# Patient Record
Sex: Female | Born: 1935 | Race: White | Hispanic: No | Smoking: Former smoker
Health system: Southern US, Community
[De-identification: ages and names within clinical notes are randomized; demographics above are authoritative.]

## PROBLEM LIST (undated history)

## (undated) ENCOUNTER — Emergency Department: Admission: EM | Payer: Medicare HMO | Source: Home / Self Care

## (undated) DIAGNOSIS — C50912 Malignant neoplasm of unspecified site of left female breast: Secondary | ICD-10-CM

## (undated) DIAGNOSIS — R319 Hematuria, unspecified: Secondary | ICD-10-CM

## (undated) DIAGNOSIS — I10 Essential (primary) hypertension: Secondary | ICD-10-CM

## (undated) HISTORY — DX: Hematuria, unspecified: R31.9

---

## 2004-10-24 DIAGNOSIS — C50912 Malignant neoplasm of unspecified site of left female breast: Secondary | ICD-10-CM

## 2004-10-24 HISTORY — DX: Malignant neoplasm of unspecified site of left female breast: C50.912

## 2004-10-24 HISTORY — PX: OTHER SURGICAL HISTORY: SHX169

## 2004-10-24 HISTORY — PX: BREAST LUMPECTOMY: SHX2

## 2004-12-10 ENCOUNTER — Ambulatory Visit: Payer: Self-pay | Admitting: Internal Medicine

## 2005-03-30 ENCOUNTER — Ambulatory Visit: Payer: Self-pay | Admitting: General Surgery

## 2005-04-11 ENCOUNTER — Ambulatory Visit: Payer: Self-pay | Admitting: Oncology

## 2005-04-21 ENCOUNTER — Ambulatory Visit: Payer: Self-pay | Admitting: General Surgery

## 2005-04-28 ENCOUNTER — Other Ambulatory Visit: Payer: Self-pay

## 2005-04-28 ENCOUNTER — Emergency Department: Payer: Self-pay | Admitting: General Practice

## 2005-05-09 ENCOUNTER — Encounter: Payer: Self-pay | Admitting: Internal Medicine

## 2005-05-15 ENCOUNTER — Emergency Department: Payer: Self-pay | Admitting: Emergency Medicine

## 2005-05-24 ENCOUNTER — Encounter: Payer: Self-pay | Admitting: Internal Medicine

## 2005-07-20 ENCOUNTER — Ambulatory Visit: Payer: Self-pay | Admitting: Oncology

## 2005-07-24 ENCOUNTER — Ambulatory Visit: Payer: Self-pay | Admitting: Oncology

## 2005-08-24 ENCOUNTER — Ambulatory Visit: Payer: Self-pay | Admitting: Oncology

## 2005-09-23 ENCOUNTER — Ambulatory Visit: Payer: Self-pay | Admitting: Oncology

## 2005-10-24 ENCOUNTER — Ambulatory Visit: Payer: Self-pay | Admitting: Oncology

## 2005-11-24 ENCOUNTER — Ambulatory Visit: Payer: Self-pay | Admitting: Oncology

## 2005-12-15 ENCOUNTER — Encounter: Payer: Self-pay | Admitting: Radiation Oncology

## 2005-12-22 ENCOUNTER — Ambulatory Visit: Payer: Self-pay | Admitting: Oncology

## 2005-12-22 ENCOUNTER — Encounter: Payer: Self-pay | Admitting: Radiation Oncology

## 2006-01-22 ENCOUNTER — Ambulatory Visit: Payer: Self-pay | Admitting: Oncology

## 2006-01-22 ENCOUNTER — Encounter: Payer: Self-pay | Admitting: Radiation Oncology

## 2006-02-21 ENCOUNTER — Ambulatory Visit: Payer: Self-pay | Admitting: Oncology

## 2006-04-07 ENCOUNTER — Ambulatory Visit: Payer: Self-pay | Admitting: Oncology

## 2006-04-23 ENCOUNTER — Ambulatory Visit: Payer: Self-pay | Admitting: Oncology

## 2006-05-24 ENCOUNTER — Ambulatory Visit: Payer: Self-pay | Admitting: Oncology

## 2006-06-24 ENCOUNTER — Ambulatory Visit: Payer: Self-pay | Admitting: Oncology

## 2006-07-24 ENCOUNTER — Ambulatory Visit: Payer: Self-pay | Admitting: Oncology

## 2006-08-16 ENCOUNTER — Ambulatory Visit: Payer: Self-pay | Admitting: Unknown Physician Specialty

## 2006-08-24 ENCOUNTER — Ambulatory Visit: Payer: Self-pay | Admitting: Oncology

## 2006-09-26 ENCOUNTER — Ambulatory Visit: Payer: Self-pay | Admitting: Oncology

## 2006-10-24 ENCOUNTER — Ambulatory Visit: Payer: Self-pay | Admitting: Oncology

## 2006-11-24 ENCOUNTER — Ambulatory Visit: Payer: Self-pay | Admitting: Oncology

## 2006-12-23 ENCOUNTER — Ambulatory Visit: Payer: Self-pay | Admitting: Oncology

## 2006-12-27 ENCOUNTER — Ambulatory Visit: Payer: Self-pay | Admitting: Surgery

## 2006-12-27 ENCOUNTER — Other Ambulatory Visit: Payer: Self-pay

## 2006-12-28 ENCOUNTER — Ambulatory Visit: Payer: Self-pay | Admitting: Surgery

## 2007-02-05 ENCOUNTER — Ambulatory Visit: Payer: Self-pay | Admitting: Oncology

## 2007-02-05 ENCOUNTER — Ambulatory Visit: Payer: Self-pay | Admitting: Radiation Oncology

## 2007-02-22 ENCOUNTER — Ambulatory Visit: Payer: Self-pay | Admitting: Radiation Oncology

## 2007-05-25 ENCOUNTER — Ambulatory Visit: Payer: Self-pay | Admitting: Oncology

## 2007-06-08 ENCOUNTER — Ambulatory Visit: Payer: Self-pay | Admitting: Oncology

## 2007-06-25 ENCOUNTER — Ambulatory Visit: Payer: Self-pay | Admitting: Oncology

## 2007-11-01 ENCOUNTER — Ambulatory Visit: Payer: Self-pay | Admitting: Family Medicine

## 2007-11-25 ENCOUNTER — Ambulatory Visit: Payer: Self-pay | Admitting: Oncology

## 2007-12-23 ENCOUNTER — Ambulatory Visit: Payer: Self-pay | Admitting: Oncology

## 2008-03-24 ENCOUNTER — Ambulatory Visit: Payer: Self-pay | Admitting: Oncology

## 2008-05-24 ENCOUNTER — Ambulatory Visit: Payer: Self-pay | Admitting: Oncology

## 2008-06-19 ENCOUNTER — Ambulatory Visit: Payer: Self-pay | Admitting: Oncology

## 2008-06-24 ENCOUNTER — Ambulatory Visit: Payer: Self-pay | Admitting: Oncology

## 2008-12-22 ENCOUNTER — Ambulatory Visit: Payer: Self-pay | Admitting: Oncology

## 2009-01-21 ENCOUNTER — Ambulatory Visit: Payer: Self-pay | Admitting: Oncology

## 2009-01-22 ENCOUNTER — Ambulatory Visit: Payer: Self-pay | Admitting: Oncology

## 2009-07-24 ENCOUNTER — Ambulatory Visit: Payer: Self-pay | Admitting: Oncology

## 2009-08-03 ENCOUNTER — Ambulatory Visit: Payer: Self-pay | Admitting: Oncology

## 2009-08-24 ENCOUNTER — Ambulatory Visit: Payer: Self-pay | Admitting: Oncology

## 2010-02-21 ENCOUNTER — Ambulatory Visit: Payer: Self-pay | Admitting: Oncology

## 2010-03-12 ENCOUNTER — Ambulatory Visit: Payer: Self-pay | Admitting: Oncology

## 2010-03-24 ENCOUNTER — Ambulatory Visit: Payer: Self-pay | Admitting: Oncology

## 2015-05-26 ENCOUNTER — Ambulatory Visit: Admission: RE | Admit: 2015-05-26 | Payer: Self-pay | Source: Ambulatory Visit | Admitting: Ophthalmology

## 2015-05-26 ENCOUNTER — Encounter: Admission: RE | Payer: Self-pay | Source: Ambulatory Visit

## 2015-05-26 SURGERY — PHACOEMULSIFICATION, CATARACT, WITH IOL INSERTION
Anesthesia: Choice | Laterality: Right

## 2015-08-03 ENCOUNTER — Encounter: Payer: Self-pay | Admitting: Oncology

## 2015-08-06 ENCOUNTER — Encounter: Payer: Self-pay | Admitting: Oncology

## 2016-02-02 DIAGNOSIS — N390 Urinary tract infection, site not specified: Secondary | ICD-10-CM | POA: Diagnosis not present

## 2016-02-02 DIAGNOSIS — M545 Low back pain: Secondary | ICD-10-CM | POA: Diagnosis not present

## 2016-02-16 DIAGNOSIS — L509 Urticaria, unspecified: Secondary | ICD-10-CM | POA: Diagnosis not present

## 2016-08-05 ENCOUNTER — Encounter: Payer: Self-pay | Admitting: Oncology

## 2016-08-05 DIAGNOSIS — Z853 Personal history of malignant neoplasm of breast: Secondary | ICD-10-CM | POA: Diagnosis not present

## 2016-08-05 DIAGNOSIS — Z1231 Encounter for screening mammogram for malignant neoplasm of breast: Secondary | ICD-10-CM | POA: Diagnosis not present

## 2016-08-24 DIAGNOSIS — I10 Essential (primary) hypertension: Secondary | ICD-10-CM | POA: Diagnosis not present

## 2016-10-05 DIAGNOSIS — E538 Deficiency of other specified B group vitamins: Secondary | ICD-10-CM | POA: Diagnosis not present

## 2016-10-05 DIAGNOSIS — F325 Major depressive disorder, single episode, in full remission: Secondary | ICD-10-CM | POA: Diagnosis not present

## 2016-10-05 DIAGNOSIS — Z0000 Encounter for general adult medical examination without abnormal findings: Secondary | ICD-10-CM | POA: Diagnosis not present

## 2016-10-05 DIAGNOSIS — I10 Essential (primary) hypertension: Secondary | ICD-10-CM | POA: Diagnosis not present

## 2016-10-05 DIAGNOSIS — Z23 Encounter for immunization: Secondary | ICD-10-CM | POA: Diagnosis not present

## 2016-11-22 DIAGNOSIS — H6123 Impacted cerumen, bilateral: Secondary | ICD-10-CM | POA: Diagnosis not present

## 2016-11-22 DIAGNOSIS — H903 Sensorineural hearing loss, bilateral: Secondary | ICD-10-CM | POA: Diagnosis not present

## 2017-01-09 DIAGNOSIS — E538 Deficiency of other specified B group vitamins: Secondary | ICD-10-CM | POA: Diagnosis not present

## 2017-01-09 DIAGNOSIS — R319 Hematuria, unspecified: Secondary | ICD-10-CM | POA: Diagnosis not present

## 2017-02-06 NOTE — Progress Notes (Signed)
02/07/2017 10:02 AM   Sophia Soto 02/19/1934 [id number]  Referring provider: Rusty Aus, MD [place] [facility] [place], [place] [address]  Chief Complaint  Patient presents with  . New Patient (Initial Visit)    hematuria referred by Dr. Emily Filbert     HPI: Patient is a 83 year old Caucasian female who presents today as a referral from their PCP, Dr. Emily Filbert, for microscopic hematuria.    Patient was found to have microscopic hematuria on 10/05/2016 with 4-10 RBC's/hpf and 01/09/2017 with 4-10 RBC's/hpf.  Patient does have a prior history of microscopic hematuria and had a cystoscopy several years ago.  No worrisome findings per patient.    She does not have a prior history of recurrent urinary tract infections, nephrolithiasis, trauma to the genitourinary tract or malignancies of the genitourinary tract.   She does not have a family medical history of nephrolithiasis, malignancies of the genitourinary tract or hematuria.   Today, she is having symptoms of nocturia and intermittency.  These are baselne.  Her UA today demonstrates 3-10 RBC's.  .  She is not experiencing any suprapubic pain, abdominal pain or flank pain.  She denies any recent fevers, chills, nausea or vomiting.   She has not had any recent imaging studies.   She is a former smoker, with a 1/2 ppd history.  Quit 30 years ago.  She was not exposed to secondhand smoke.  She has not worked with chemicals.        PMH: Past Medical History:  Diagnosis Date  . Breast cancer ([place])   . Hematuria     Surgical History: Past Surgical History:  Procedure Laterality Date  . BREAST LUMPECTOMY  2006  . broken neck  2006    Home Medications:  Allergies as of 02/07/2017      Reactions   Penicillin G Rash      Medication List       Accurate as of 02/07/17 10:02 AM. Always use your most recent med list.          aspirin EC 81 MG tablet Take by mouth.     Biotin 1 MG Caps Take by mouth.   FISH OIL PO Take by mouth.   ibuprofen 200 MG tablet Commonly known as:  ADVIL,MOTRIN Take by mouth.   lisinopril 10 MG tablet Commonly known as:  PRINIVIL,ZESTRIL Take by mouth.   sertraline 100 MG tablet Commonly known as:  ZOLOFT Take by mouth.   VITAMIN D-1000 MAX ST 1000 units tablet Generic drug:  Cholecalciferol Take by mouth.       Allergies:  Allergies  Allergen Reactions  . Penicillin G Rash    Family History: Family History  Problem Relation Age of Onset  . Prostate cancer Neg Hx   . Kidney cancer Neg Hx   . Bladder Cancer Neg Hx     Social History:  reports that she has quit smoking. She has never used smokeless tobacco. She reports that she drinks alcohol. She reports that she does not use drugs.  ROS: UROLOGY Frequent Urination?: No Hard to postpone urination?: No Burning/pain with urination?: No Get up at night to urinate?: Yes Leakage of urine?: No Urine stream starts and stops?: Yes Trouble starting stream?: No Do you have to strain to urinate?: No Blood in urine?: No Urinary tract infection?: No Sexually transmitted disease?: No Injury to kidneys or bladder?: No Painful intercourse?: No Weak stream?: No Currently pregnant?: No Vaginal  bleeding?: No Last menstrual period?: n  Gastrointestinal Nausea?: No Vomiting?: No Indigestion/heartburn?: No Diarrhea?: No Constipation?: No  Constitutional Fever: No Night sweats?: No Weight loss?: No Fatigue?: No  Skin Skin rash/lesions?: No Itching?: No  Eyes Blurred vision?: No Double vision?: No  Ears/Nose/Throat Sore throat?: No Sinus problems?: No  Hematologic/Lymphatic Swollen glands?: No Easy bruising?: Yes  Cardiovascular Leg swelling?: No Chest pain?: No  Respiratory Cough?: No Shortness of breath?: No  Endocrine Excessive thirst?: No  Musculoskeletal Back pain?: No Joint pain?: No  Neurological Headaches?:  No Dizziness?: Yes  Psychologic Depression?: No Anxiety?: Yes  Physical Exam: BP 135/82   Pulse 73   Ht 5\' 4"  (1.626 m)   Wt 125 lb 4.8 oz (56.8 kg)   BMI 21.51 kg/m   Constitutional: Well nourished. Alert and oriented, No acute distress. HEENT: [place] AT, moist mucus membranes. Trachea midline, no masses. Cardiovascular: No clubbing, cyanosis, or edema. Respiratory: Normal respiratory effort, no increased work of breathing. GI: Abdomen is soft, non tender, non distended, no abdominal masses. Liver and spleen not palpable.  No hernias appreciated.  Stool sample for occult testing is not indicated.   GU: No CVA tenderness.  No bladder fullness or masses.   Skin: No rashes, bruises or suspicious lesions. Lymph: No cervical or inguinal adenopathy. Neurologic: Grossly intact, no focal deficits, moving all 4 extremities. Psychiatric: Normal mood and affect.  Laboratory Data: Urinalysis 3-10 RBC's.  See EPIC.     Assessment & Plan:    1. Microscopic hematuria  - I explained to the patient that there are a number of causes that can be associated with blood in the urine, such as stones,  UTI's, damage to the urinary tract and/or cancer.  - At this time, I felt that the patient warranted further urologic evaluation.   The AUA guidelines state that a CT urogram is the preferred imaging study to evaluate hematuria.  - I explained to the patient that a contrast material will be injected into a vein and that in rare instances, an allergic reaction can result and may even life threatening   The patient denies any allergies to contrast, iodine and/or seafood and is not taking metformin.  - Her reproductive status is postmenopausal  - Following the imaging study,  I've recommended a cystoscopy. I described how this is performed, typically in an office setting with a flexible cystoscope. We described the risks, benefits, and possible side effects, the most common of which is a minor amount of blood  in the urine and/or burning which usually resolves in 24 to 48 hours.    - The patient had the opportunity to ask questions which were answered. Based upon this discussion, the patient is willing to proceed. Therefore, I've ordered: a CT Urogram and cystoscopy.  - The patient will return following all of the above for discussion of the results.   - UA  - Urine culture  - BUN + creatinine     Return for CT Urogram report and cystoscopy.  These notes generated with voice recognition software. I apologize for typographical errors.  Zara Council, [facility] [address], [place], [place] [address] [phone]

## 2017-02-07 ENCOUNTER — Ambulatory Visit: Payer: PPO | Admitting: Urology

## 2017-02-07 ENCOUNTER — Encounter: Payer: Self-pay | Admitting: Urology

## 2017-02-07 VITALS — BP 135/82 | HR 73 | Ht 64.0 in | Wt 125.3 lb

## 2017-02-07 DIAGNOSIS — R3129 Other microscopic hematuria: Secondary | ICD-10-CM | POA: Diagnosis not present

## 2017-02-07 LAB — URINALYSIS, COMPLETE
Bilirubin, UA: NEGATIVE
GLUCOSE, UA: NEGATIVE
Ketones, UA: NEGATIVE
LEUKOCYTES UA: NEGATIVE
NITRITE UA: NEGATIVE
PROTEIN UA: NEGATIVE
SPEC GRAV UA: 1.01 (ref 1.005–1.030)
Urobilinogen, Ur: 0.2 mg/dL (ref 0.2–1.0)
pH, UA: 6 (ref 5.0–7.5)

## 2017-02-07 LAB — MICROSCOPIC EXAMINATION: Bacteria, UA: NONE SEEN

## 2017-02-07 NOTE — Patient Instructions (Addendum)

## 2017-02-08 LAB — BUN+CREAT
BUN/Creatinine Ratio: 15 (ref 12–28)
BUN: 15 mg/dL (ref 8–27)
Creatinine, Ser: 0.97 mg/dL (ref 0.57–1.00)
GFR calc Af Amer: 64 mL/min/{1.73_m2} (ref >59)
GFR calc non Af Amer: 55 mL/min/{1.73_m2} — ABNORMAL LOW (ref >59)

## 2017-02-10 LAB — CULTURE, URINE COMPREHENSIVE

## 2017-02-28 ENCOUNTER — Encounter: Payer: Self-pay | Admitting: Urology

## 2017-02-28 ENCOUNTER — Ambulatory Visit: Payer: PPO | Admitting: Urology

## 2017-02-28 VITALS — BP 139/77 | HR 67 | Ht 64.0 in | Wt 125.8 lb

## 2017-02-28 DIAGNOSIS — R3129 Other microscopic hematuria: Secondary | ICD-10-CM

## 2017-02-28 MED ORDER — LIDOCAINE HCL 2 % EX GEL: 1.0000 "application " | Freq: Once | CUTANEOUS | Status: AC

## 2017-02-28 MED ORDER — CIPROFLOXACIN HCL 500 MG PO TABS: 500.0000 mg | ORAL_TABLET | Freq: Once | ORAL | Status: DC

## 2017-02-28 NOTE — Progress Notes (Signed)
   02/28/17  CC:  Chief Complaint  Patient presents with  . Cysto    HPI:  Blood pressure 139/77, pulse 67, height 5\' 4"  (1.626 m), weight 57.1 kg (125 lb 12.8 oz). NED. A&Ox3.   No respiratory distress   Abd soft, NT, ND Normal external genitalia with patent urethral meatus  Cystoscopy Procedure Note  Patient identification was confirmed, informed consent was obtained, and patient was prepped using Betadine solution.  Lidocaine jelly was administered per urethral meatus.    Preoperative abx where received prior to procedure.    Procedure: - Flexible cystoscope introduced, without any difficulty.   - Thorough search of the bladder revealed:    normal urethral meatus    normal urothelium    no stones    no ulcers     no tumors    no urethral polyps    no trabeculation  - Ureteral orifices were normal in position and appearance.  Post-Procedure: - Patient tolerated the procedure well  Assessment/ Plan:  The patient cystoscopic evaluation was normal. The patient has a CT scan pending. We'll contact her with the results as long as they're normal, she can follow with [place] as needed.    Ardis Hughs, MD

## 2017-03-01 LAB — URINALYSIS, COMPLETE
Bilirubin, UA: NEGATIVE
GLUCOSE, UA: NEGATIVE
KETONES UA: NEGATIVE
LEUKOCYTES UA: NEGATIVE
Nitrite, UA: NEGATIVE
Protein, UA: NEGATIVE
SPEC GRAV UA: 1.01 (ref 1.005–1.030)
Urobilinogen, Ur: 0.2 mg/dL (ref 0.2–1.0)
pH, UA: 6 (ref 5.0–7.5)

## 2017-03-01 LAB — MICROSCOPIC EXAMINATION
Bacteria, UA: NONE SEEN
Epithelial Cells (non renal): NONE SEEN /hpf (ref 0–10)

## 2017-03-08 ENCOUNTER — Ambulatory Visit
Admission: RE | Admit: 2017-03-08 | Discharge: 2017-03-08 | Disposition: A | Discharge status: Home / Self Care | Payer: PPO | Source: Ambulatory Visit | Attending: Urology | Admitting: Urology

## 2017-03-08 DIAGNOSIS — R3129 Other microscopic hematuria: Secondary | ICD-10-CM

## 2017-03-08 DIAGNOSIS — R918 Other nonspecific abnormal finding of lung field: Secondary | ICD-10-CM | POA: Insufficient documentation

## 2017-03-08 DIAGNOSIS — I700 Atherosclerosis of aorta: Secondary | ICD-10-CM | POA: Diagnosis not present

## 2017-03-08 HISTORY — DX: Essential (primary) hypertension: I10

## 2017-03-08 HISTORY — DX: Malignant neoplasm of unspecified site of left female breast: C50.912

## 2017-03-08 MED ORDER — IOPAMIDOL (ISOVUE-300) INJECTION 61%
125.0000 mL | Freq: Once | INTRAVENOUS | Status: AC | PRN
  Administered 2017-03-08: 125 mL via INTRAVENOUS

## 2017-03-14 ENCOUNTER — Telehealth: Payer: Self-pay

## 2017-03-14 NOTE — Telephone Encounter (Signed)
Sophia Hughs, MD  Toniann Fail C, LPN        Please contact patient and let her know that her CT scan was normal. No follow-up needed.  Thank you,  bh    [facility]

## 2017-03-14 NOTE — Telephone Encounter (Signed)
Spoke with pt in reference to CT results. Pt voiced understanding.  

## 2017-03-20 ENCOUNTER — Telehealth: Payer: Self-pay | Admitting: Urology

## 2017-03-28 NOTE — Telephone Encounter (Signed)
Error

## 2017-03-28 NOTE — Telephone Encounter (Signed)
error 

## 2017-04-29 ENCOUNTER — Encounter: Payer: Self-pay | Admitting: Emergency Medicine

## 2017-04-29 ENCOUNTER — Emergency Department: Payer: PPO

## 2017-04-29 ENCOUNTER — Emergency Department
Admission: EM | Admit: 2017-04-29 | Discharge: 2017-04-29 | Disposition: A | Discharge status: Home / Self Care | Payer: PPO | Attending: Emergency Medicine | Admitting: Emergency Medicine

## 2017-04-29 DIAGNOSIS — Y92512 Supermarket, store or market as the place of occurrence of the external cause: Secondary | ICD-10-CM | POA: Diagnosis not present

## 2017-04-29 DIAGNOSIS — Y998 Other external cause status: Secondary | ICD-10-CM | POA: Insufficient documentation

## 2017-04-29 DIAGNOSIS — Z87891 Personal history of nicotine dependence: Secondary | ICD-10-CM | POA: Insufficient documentation

## 2017-04-29 DIAGNOSIS — Z853 Personal history of malignant neoplasm of breast: Secondary | ICD-10-CM | POA: Insufficient documentation

## 2017-04-29 DIAGNOSIS — W01198A Fall on same level from slipping, tripping and stumbling with subsequent striking against other object, initial encounter: Secondary | ICD-10-CM | POA: Diagnosis not present

## 2017-04-29 DIAGNOSIS — S6991XA Unspecified injury of right wrist, hand and finger(s), initial encounter: Secondary | ICD-10-CM | POA: Diagnosis not present

## 2017-04-29 DIAGNOSIS — W19XXXA Unspecified fall, initial encounter: Secondary | ICD-10-CM

## 2017-04-29 DIAGNOSIS — Z7982 Long term (current) use of aspirin: Secondary | ICD-10-CM | POA: Insufficient documentation

## 2017-04-29 DIAGNOSIS — Y9301 Activity, walking, marching and hiking: Secondary | ICD-10-CM | POA: Diagnosis not present

## 2017-04-29 DIAGNOSIS — S0181XA Laceration without foreign body of other part of head, initial encounter: Secondary | ICD-10-CM | POA: Diagnosis not present

## 2017-04-29 DIAGNOSIS — I10 Essential (primary) hypertension: Secondary | ICD-10-CM | POA: Insufficient documentation

## 2017-04-29 DIAGNOSIS — S0121XA Laceration without foreign body of nose, initial encounter: Secondary | ICD-10-CM | POA: Diagnosis not present

## 2017-04-29 DIAGNOSIS — Z23 Encounter for immunization: Secondary | ICD-10-CM | POA: Insufficient documentation

## 2017-04-29 DIAGNOSIS — Z79899 Other long term (current) drug therapy: Secondary | ICD-10-CM | POA: Diagnosis not present

## 2017-04-29 MED ORDER — TETANUS-DIPHTH-ACELL PERTUSSIS 5-2.5-18.5 LF-MCG/0.5 IM SUSP
0.5000 mL | Freq: Once | INTRAMUSCULAR | Status: AC
  Administered 2017-04-29: 0.5 mL via INTRAMUSCULAR
  Filled 2017-04-29: qty 0.5

## 2017-04-29 MED ORDER — LIDOCAINE HCL (PF) 1 % IJ SOLN
INTRAMUSCULAR | Status: AC
  Filled 2017-04-29: qty 5

## 2017-04-29 NOTE — ED Notes (Signed)
Pt sitting in bed. Wounds dressed in triage. Pt tripped and fell onto parking lot. Able to ambulate without assistance. Injury to hand and forehead and nose (abrasions and skin tears)

## 2017-04-29 NOTE — ED Provider Notes (Signed)
[facility] Emergency Department Provider Note  Time seen: 4:44 PM  I have reviewed the triage vital signs and the nursing notes.   HISTORY  Chief Complaint Fall    HPI Sophia Soto is a 83 y.o. female with a past medical history of hypertension who presents the emergency department after a fall. According to the patient she was walking into the store when she tripped falling forward landing on cement. Denies loss of consciousness nausea or vomiting. Patient has been ambulatory since the fall. She did suffer a cut to her forehead, the bridge of her nose, and skin tears to her right hand. Patient is awake alert oriented, well appearing, laughing and joking.  Past Medical History:  Diagnosis Date  . Breast cancer, left ([place]) 2006   Lumpectomy, chemo + rad tx's  . Hematuria   . Hypertension     There are no active problems to display for this patient.   Past Surgical History:  Procedure Laterality Date  . BREAST LUMPECTOMY  2006  . broken neck  2006    Prior to Admission medications   Medication Sig Start Date End Date Taking? Authorizing Provider  aspirin EC 81 MG tablet Take by mouth.    [provider]  Biotin 1 MG CAPS Take by mouth.    [provider]  Cholecalciferol (VITAMIN D-1000 MAX ST) 1000 units tablet Take by mouth.    [provider]  ibuprofen (ADVIL,MOTRIN) 200 MG tablet Take by mouth.    [provider]  lisinopril (PRINIVIL,ZESTRIL) 10 MG tablet Take by mouth. 10/26/16 10/26/17  [provider]  Omega-3 Fatty Acids (FISH OIL PO) Take by mouth.    [provider]  sertraline (ZOLOFT) 100 MG tablet Take by mouth.    [provider]    Allergies  Allergen Reactions  . Penicillin G Rash    Family History  Problem Relation Age of Onset  . Prostate cancer Neg Hx   . Kidney cancer Neg Hx   . Bladder Cancer Neg Hx     Social History Social History  Substance Use  Topics  . Smoking status: Former Research scientist (life sciences)  . Smokeless tobacco: Never Used     Comment: quit 1990  . Alcohol use Yes     Comment: wine at night    Review of Systems Constitutional: Negative for fever. Eyes: Negative for visual changes. ENT: Laceration to bridge of nose Cardiovascular: Negative for chest pain. Respiratory: Negative for shortness of breath. Gastrointestinal: Negative for abdominal pain or negative for nausea or vomiting. Musculoskeletal: Negative for back pain. Negative for neck pain. Skin: Laceration to forehead, bridge of nose and skin tears to right hand Neurological: Negative for headaches, focal weakness or numbness. All other ROS negative  ____________________________________________   PHYSICAL EXAM:  VITAL SIGNS: ED Triage Vitals  Enc Vitals Group     BP 04/29/17 1418 (!) 173/85     Pulse Rate 04/29/17 1418 89     Resp 04/29/17 1418 18     Temp 04/29/17 1418 97.7 F (36.5 C)     Temp Source 04/29/17 1418 Oral     SpO2 04/29/17 1418 99 %     Weight 04/29/17 1418 125 lb (56.7 kg)     Height 04/29/17 1418 5\' 4"  (1.626 m)     Head Circumference --      Peak Flow --      Pain Score 04/29/17 1417 9     Pain Loc --  Pain Edu? --      Excl. in [place]? --     Constitutional: Alert and oriented. Well appearing and in no distress. Eyes: Normal exam, EOMI, PERRL ENT   Head: 1 cm laceration to the bridge of her nose. 1.5 cm vertical laceration to the forehead   Nose: No congestion/rhinnorhea.   Mouth/Throat: Mucous membranes are moist. No oral trauma Cardiovascular: Normal rate, regular rhythm. No murmur Respiratory: Normal respiratory effort without tachypnea nor retractions. Breath sounds are clear  Gastrointestinal: Soft and nontender. No distention.   Musculoskeletal: Patient does have 3 skin tears to the dorsal aspect of the right hand, all with good skin coverage. No C-spine T-spine or L-spine tenderness. Good range of motion in all  extremities and in all joints. Neurologic:  Normal speech and language. No gross focal neurologic deficits. Skin:  Skin tears and lacerations as noted above Psychiatric: Mood and affect are normal  ____________________________________________   RADIOLOGY  Right hand x-ray negative  ____________________________________________   INITIAL IMPRESSION / ASSESSMENT AND PLAN / ED COURSE  Pertinent labs & imaging results that were available during my care of the patient were reviewed by me and considered in my medical decision making (see chart for details).  Patient presents to the emergency department for a fall. Patient does have a non-gaping 1.5 cm laceration to the center of her forehead which was repaired with Dermabond. Patient does have a 1 cm laceration to the bridge of the nose with mild gaping, this was repaired with 3 5-0 rapid Vicryl sutures. Skin tears were washed, had good skin coverage, Steri-Strips applied. Discussed PCP follow-up for recheck.  LACERATION REPAIR Performed by: Harvest Dark Authorized by: Harvest Dark Consent: Verbal consent obtained. Risks and benefits: risks, benefits and alternatives were discussed Consent given by: patient Patient identity confirmed: provided demographic data Prepped and Draped in normal sterile fashion Wound explored  Laceration Location: nose   Laceration Length: 1  cm  No Foreign Bodies seen or palpated  Anesthesia: local infiltration  Local anesthetic: lidocaine 1% wo epinephrine  Anesthetic total: 2 ml  Irrigation method: syringe Amount of cleaning: standard  Skin closure: 5-0 rapid vicryl   Number of sutures: 3   Technique: simple interrupted  Patient tolerance: Patient tolerated the procedure well with no immediate complications.   ____________________________________________   FINAL CLINICAL IMPRESSION(S) / ED DIAGNOSES  fall  laceration    Harvest Dark, MD 04/29/17 [phone]

## 2017-04-29 NOTE — ED Triage Notes (Addendum)
Patient presents to the ED with abrasion to forehead and bridge of nose and multiple skin tears to her right hand.  Patient states she tripped over a concrete median as she was in the parking lot of the [facility] store to get rum for pina coladas.  Patient denies losing consciousness or dizziness.  Patient does not take blood thinners.

## 2017-05-19 DIAGNOSIS — M25511 Pain in right shoulder: Secondary | ICD-10-CM | POA: Diagnosis not present

## 2017-05-25 DIAGNOSIS — M25511 Pain in right shoulder: Secondary | ICD-10-CM | POA: Diagnosis not present

## 2017-08-07 DIAGNOSIS — Z1231 Encounter for screening mammogram for malignant neoplasm of breast: Secondary | ICD-10-CM | POA: Diagnosis not present

## 2017-08-07 DIAGNOSIS — Z853 Personal history of malignant neoplasm of breast: Secondary | ICD-10-CM | POA: Diagnosis not present

## 2017-10-25 DIAGNOSIS — E538 Deficiency of other specified B group vitamins: Secondary | ICD-10-CM | POA: Diagnosis not present

## 2017-10-25 DIAGNOSIS — R311 Benign essential microscopic hematuria: Secondary | ICD-10-CM | POA: Diagnosis not present

## 2017-10-25 DIAGNOSIS — Z0000 Encounter for general adult medical examination without abnormal findings: Secondary | ICD-10-CM | POA: Diagnosis not present

## 2017-10-25 DIAGNOSIS — E782 Mixed hyperlipidemia: Secondary | ICD-10-CM | POA: Diagnosis not present

## 2018-02-09 IMAGING — CT CT ABD-PEL WO/W CM
2 of 6 series · 13 of 32 positions shown, 18 images · IV contrast (iopamidol)
Comparison: None.

CLINICAL DATA: Microscopic hematuria.

EXAM:
CT ABDOMEN AND PELVIS WITHOUT AND WITH CONTRAST
TECHNIQUE: Multidetector CT imaging of the abdomen and pelvis was performed
following the standard protocol before and following the bolus
administration of intravenous contrast.
CONTRAST:  125mL 13WQ2Y-QUU IOPAMIDOL (13WQ2Y-QUU) INJECTION 61%

[Series 2: axial pre · axial · non-contrast · 0.64mm/px · z∈[-898,-588]mm · 6 of 88 slices shown]
[im 13/88  soft-tissue]
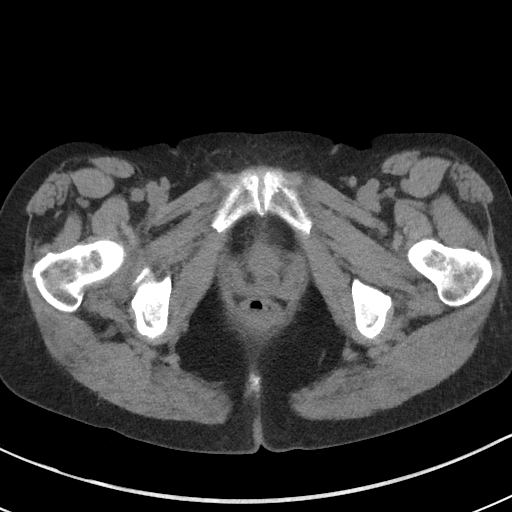
[im 25/88  soft-tissue]
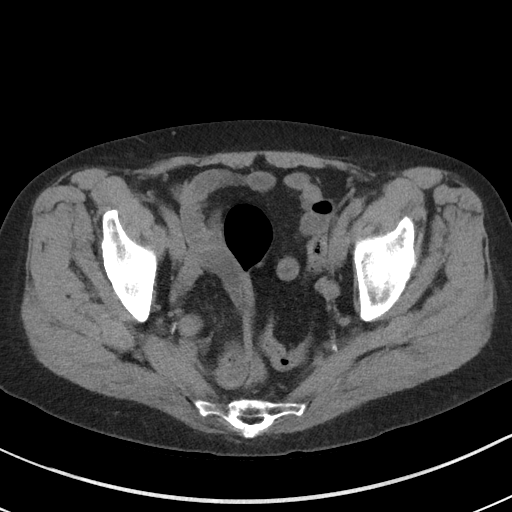
[im 38/88  soft-tissue]
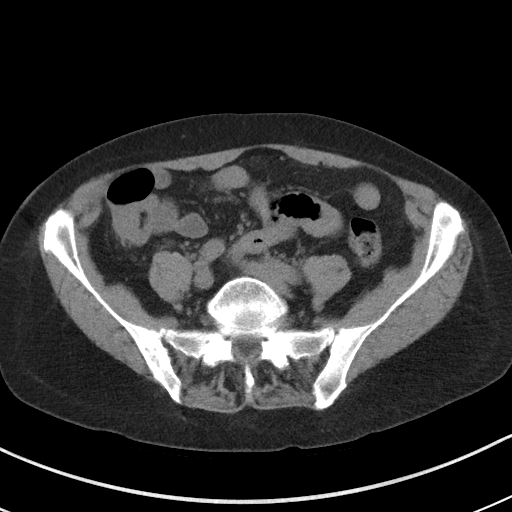
[im 50/88  soft-tissue]
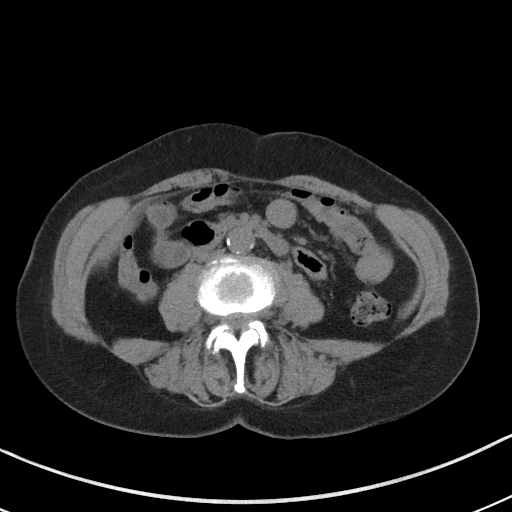
[im 63/88  soft-tissue]
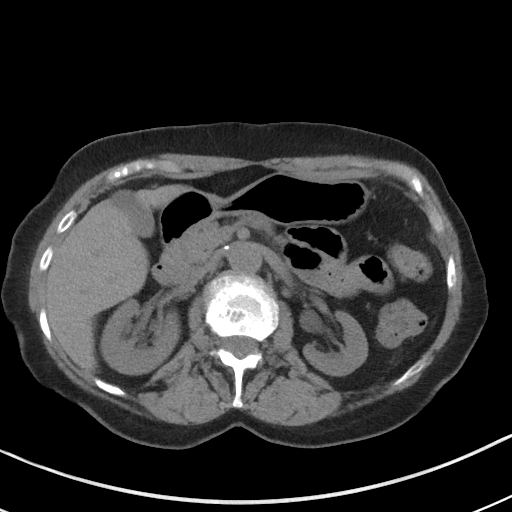
[im 75/88  soft-tissue]
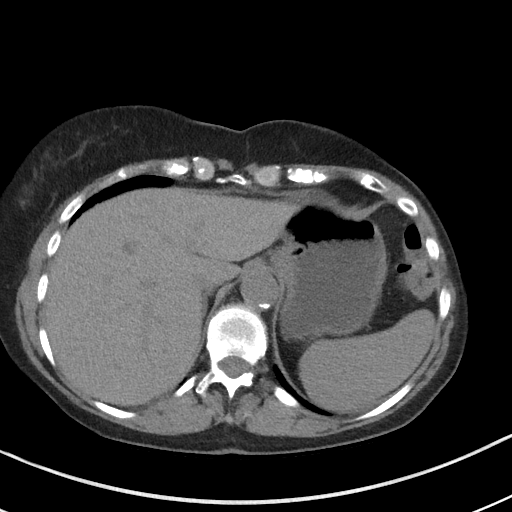

[Series 13: axial delay · axial · delayed · 0.60mm/px · z∈[-1010,-656]mm · 7 of 95 slices shown, 12 images]
[im 12/95  soft-tissue]
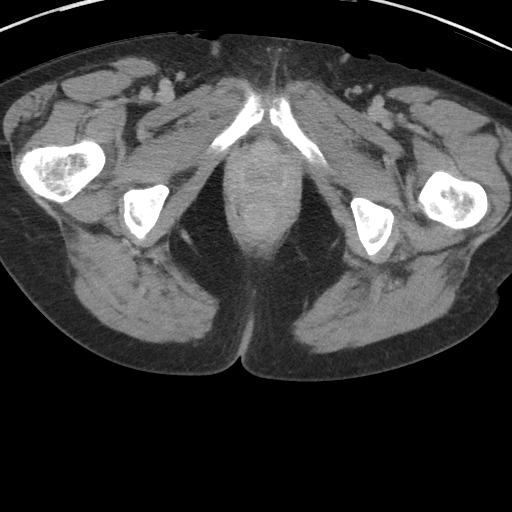
[im 12/95  bone]
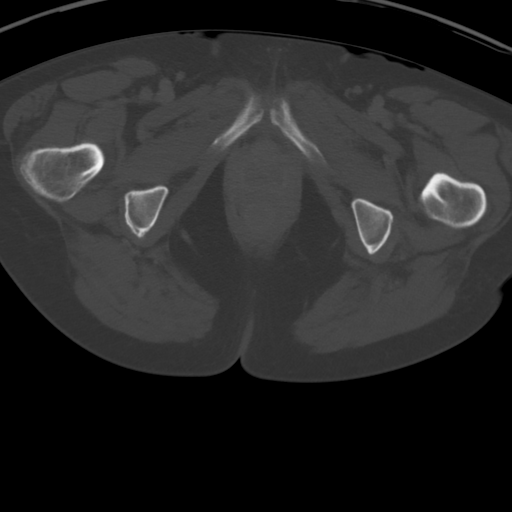
[im 24/95  soft-tissue]
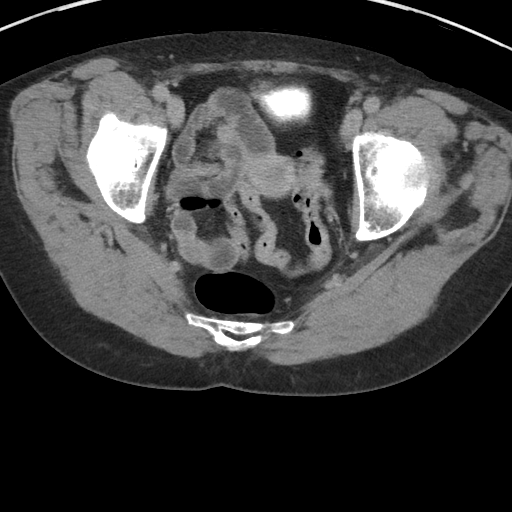
[im 36/95  soft-tissue]
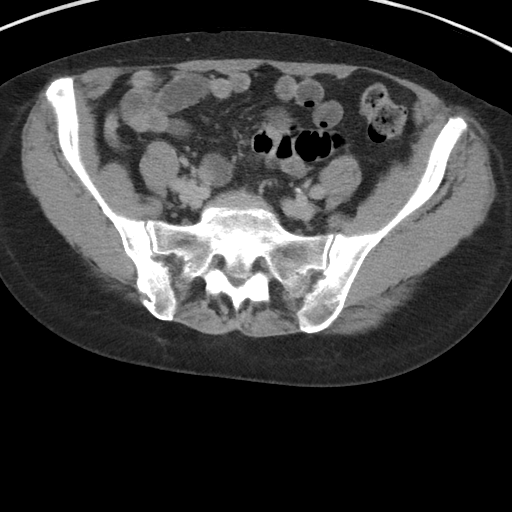
[im 48/95  soft-tissue]
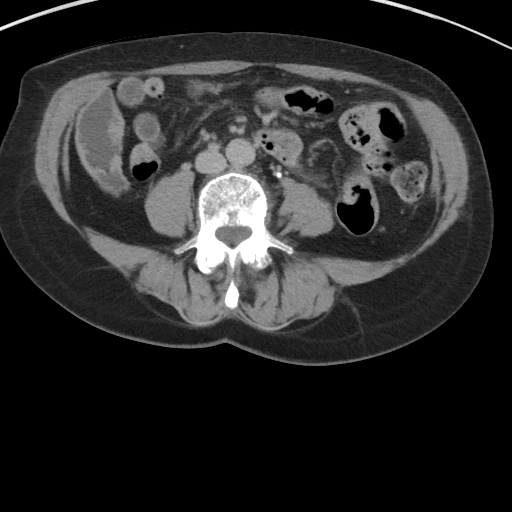
[im 48/95  lung]
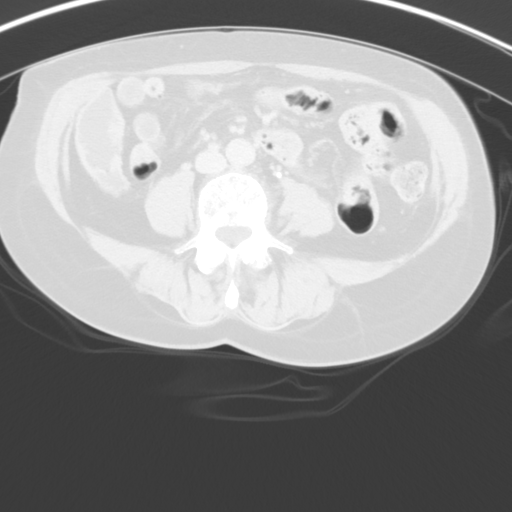
[im 59/95  soft-tissue]
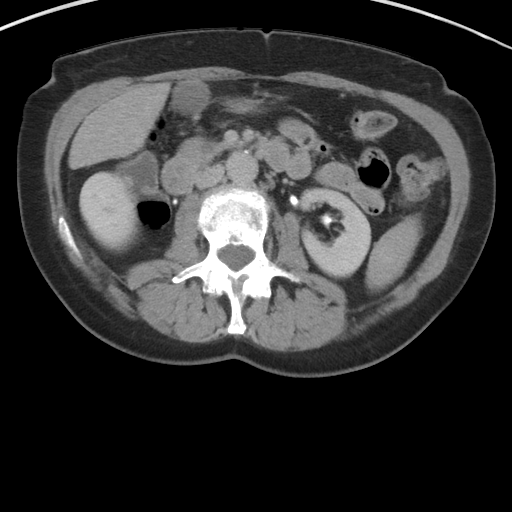
[im 59/95  lung]
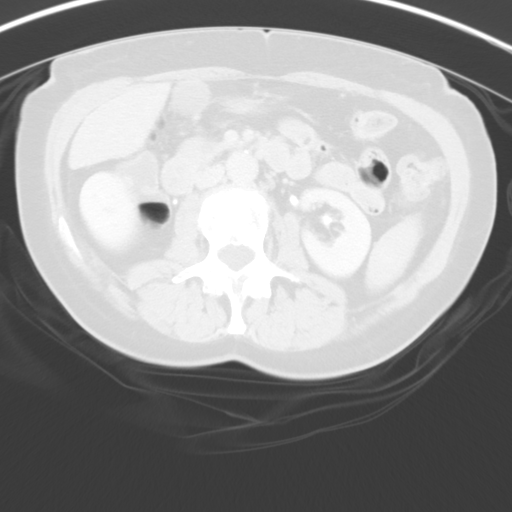
[im 71/95  soft-tissue]
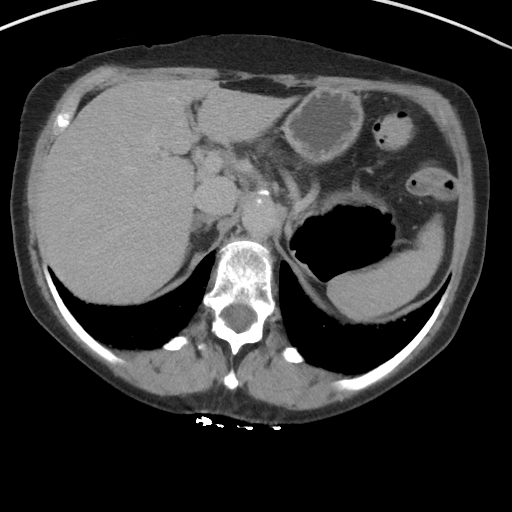
[im 71/95  lung]
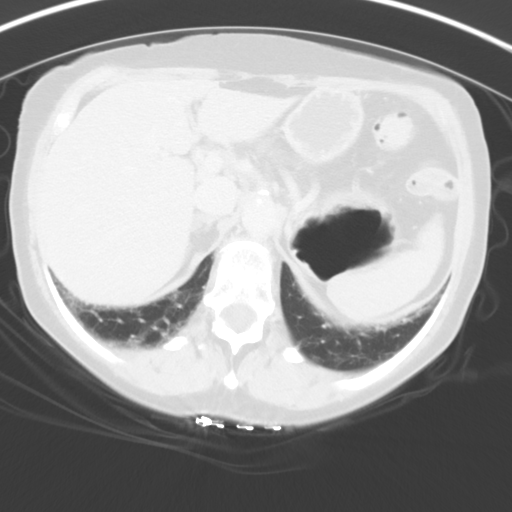
[im 83/95  soft-tissue]
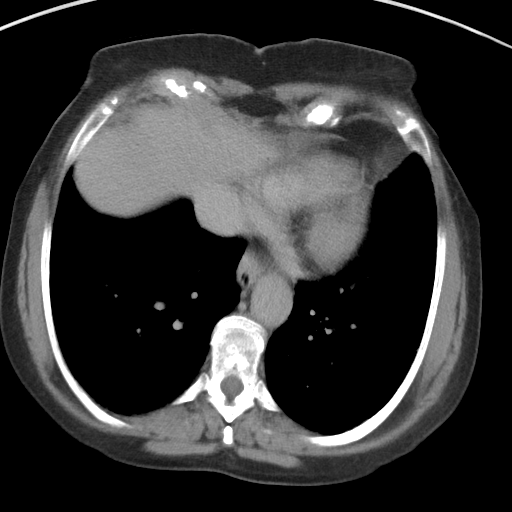
[im 83/95  lung]
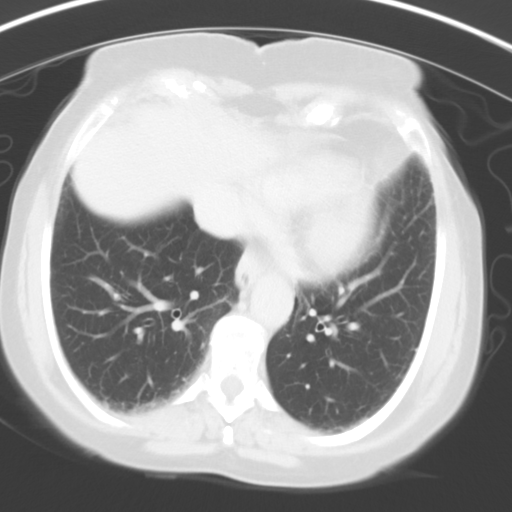

[13 of 32 positions shown; findings below may reference images not displayed]

FINDINGS: Lower chest: Small nodules in the left lower lobe measure up to 4
mm. Heart size normal. No pericardial or pleural effusion. Calcified
subcarinal lymph nodes.

Hepatobiliary: Low-attenuation lesions in the liver measure up to
2.1 cm and are likely cysts. Liver and gallbladder are otherwise
unremarkable. No biliary ductal dilatation.

Pancreas: Negative.

Spleen: Negative.

Adrenals/Urinary Tract: Adrenal glands and kidneys are unremarkable.
No urinary stones. Mid and lower portions of both ureters are poorly
opacified, limiting evaluation. Otherwise, no filling defects in the
intrarenal collecting systems, visualized portions of the ureters or
bladder.

Stomach/Bowel: Stomach, small bowel, appendix and colon are
unremarkable.

Vascular/Lymphatic: Atherosclerotic calcification of the arterial
vasculature without abdominal aortic aneurysm. No pathologically
enlarged lymph nodes.

Reproductive: Uterus is visualized.  No adnexal mass.

Other: No free fluid.  Mesenteries and peritoneum are unremarkable.

Musculoskeletal: Postoperative changes in the medial right iliac
wing. Degenerative changes in the spine. No worrisome lytic or
sclerotic lesions. Minimal grade 1 anterolisthesis of L4 on L5.
IMPRESSION: 1. Mid and lower portions of both ureters are poorly opacified,
limiting evaluation. Otherwise, no findings to explain the patient's
hematuria.
2. Left lower lobe nodules measure 4 mm, nonspecific. No follow-up
needed if patient is low-risk (and has no known or suspected primary
neoplasm). Non-contrast chest CT can be considered in 12 months if
patient is high-risk. This recommendation follows the consensus
statement: Guidelines for Management of Incidental Pulmonary Nodules
Detected on CT Images: From the [HOSPITAL] 7581; Radiology
3.  Aortic atherosclerosis (6673Q-170.0).

## 2018-05-21 DIAGNOSIS — H25813 Combined forms of age-related cataract, bilateral: Secondary | ICD-10-CM | POA: Diagnosis not present

## 2018-06-15 DIAGNOSIS — H02839 Dermatochalasis of unspecified eye, unspecified eyelid: Secondary | ICD-10-CM | POA: Diagnosis not present

## 2018-06-15 DIAGNOSIS — H2513 Age-related nuclear cataract, bilateral: Secondary | ICD-10-CM | POA: Diagnosis not present

## 2018-06-15 DIAGNOSIS — H18413 Arcus senilis, bilateral: Secondary | ICD-10-CM | POA: Diagnosis not present

## 2018-08-01 DIAGNOSIS — H6123 Impacted cerumen, bilateral: Secondary | ICD-10-CM | POA: Diagnosis not present

## 2018-08-01 DIAGNOSIS — H900 Conductive hearing loss, bilateral: Secondary | ICD-10-CM | POA: Diagnosis not present

## 2018-08-08 DIAGNOSIS — Z1231 Encounter for screening mammogram for malignant neoplasm of breast: Secondary | ICD-10-CM | POA: Diagnosis not present

## 2018-08-08 DIAGNOSIS — Z853 Personal history of malignant neoplasm of breast: Secondary | ICD-10-CM | POA: Diagnosis not present

## 2018-10-31 DIAGNOSIS — F325 Major depressive disorder, single episode, in full remission: Secondary | ICD-10-CM | POA: Diagnosis not present

## 2018-10-31 DIAGNOSIS — Z0000 Encounter for general adult medical examination without abnormal findings: Secondary | ICD-10-CM | POA: Diagnosis not present

## 2018-10-31 DIAGNOSIS — E538 Deficiency of other specified B group vitamins: Secondary | ICD-10-CM | POA: Diagnosis not present

## 2018-10-31 DIAGNOSIS — E782 Mixed hyperlipidemia: Secondary | ICD-10-CM | POA: Diagnosis not present

## 2018-11-01 DIAGNOSIS — E538 Deficiency of other specified B group vitamins: Secondary | ICD-10-CM | POA: Diagnosis not present

## 2018-11-01 DIAGNOSIS — E782 Mixed hyperlipidemia: Secondary | ICD-10-CM | POA: Diagnosis not present

## 2020-01-02 ENCOUNTER — Other Ambulatory Visit: Payer: Self-pay | Admitting: Internal Medicine

## 2020-01-02 ENCOUNTER — Ambulatory Visit
Admission: RE | Admit: 2020-01-02 | Discharge: 2020-01-02 | Disposition: A | Discharge status: Home / Self Care | Payer: Medicare HMO | Source: Ambulatory Visit | Attending: Internal Medicine | Admitting: Internal Medicine

## 2020-01-02 ENCOUNTER — Other Ambulatory Visit: Payer: Self-pay

## 2020-01-02 DIAGNOSIS — R1901 Right upper quadrant abdominal swelling, mass and lump: Secondary | ICD-10-CM

## 2020-01-02 DIAGNOSIS — Z853 Personal history of malignant neoplasm of breast: Secondary | ICD-10-CM

## 2020-02-28 ENCOUNTER — Encounter: Payer: Self-pay | Admitting: Emergency Medicine

## 2020-02-28 ENCOUNTER — Emergency Department: Payer: Medicare HMO

## 2020-02-28 ENCOUNTER — Other Ambulatory Visit: Payer: Self-pay

## 2020-02-28 ENCOUNTER — Inpatient Hospital Stay
Admission: EM | Admit: 2020-02-28 | Discharge: 2020-03-02 | DRG: 871 | Disposition: A | Discharge status: Home / Self Care | Payer: Medicare HMO | Attending: Internal Medicine | Admitting: Internal Medicine

## 2020-02-28 DIAGNOSIS — E039 Hypothyroidism, unspecified: Secondary | ICD-10-CM | POA: Diagnosis present

## 2020-02-28 DIAGNOSIS — I10 Essential (primary) hypertension: Secondary | ICD-10-CM | POA: Diagnosis present

## 2020-02-28 DIAGNOSIS — Z20822 Contact with and (suspected) exposure to covid-19: Secondary | ICD-10-CM | POA: Diagnosis present

## 2020-02-28 DIAGNOSIS — E876 Hypokalemia: Secondary | ICD-10-CM | POA: Diagnosis present

## 2020-02-28 DIAGNOSIS — N179 Acute kidney failure, unspecified: Secondary | ICD-10-CM | POA: Diagnosis present

## 2020-02-28 DIAGNOSIS — A419 Sepsis, unspecified organism: Secondary | ICD-10-CM | POA: Diagnosis not present

## 2020-02-28 DIAGNOSIS — Z853 Personal history of malignant neoplasm of breast: Secondary | ICD-10-CM

## 2020-02-28 DIAGNOSIS — R6520 Severe sepsis without septic shock: Secondary | ICD-10-CM | POA: Diagnosis present

## 2020-02-28 DIAGNOSIS — Z880 Allergy status to penicillin: Secondary | ICD-10-CM | POA: Diagnosis not present

## 2020-02-28 DIAGNOSIS — G309 Alzheimer's disease, unspecified: Secondary | ICD-10-CM | POA: Diagnosis present

## 2020-02-28 DIAGNOSIS — I4891 Unspecified atrial fibrillation: Secondary | ICD-10-CM | POA: Diagnosis present

## 2020-02-28 DIAGNOSIS — E872 Acidosis: Secondary | ICD-10-CM | POA: Diagnosis present

## 2020-02-28 DIAGNOSIS — Z87891 Personal history of nicotine dependence: Secondary | ICD-10-CM

## 2020-02-28 DIAGNOSIS — R6510 Systemic inflammatory response syndrome (SIRS) of non-infectious origin without acute organ dysfunction: Secondary | ICD-10-CM | POA: Diagnosis not present

## 2020-02-28 DIAGNOSIS — G9341 Metabolic encephalopathy: Secondary | ICD-10-CM | POA: Diagnosis present

## 2020-02-28 DIAGNOSIS — R008 Other abnormalities of heart beat: Secondary | ICD-10-CM | POA: Diagnosis present

## 2020-02-28 DIAGNOSIS — F0280 Dementia in other diseases classified elsewhere without behavioral disturbance: Secondary | ICD-10-CM | POA: Diagnosis present

## 2020-02-28 LAB — COMPREHENSIVE METABOLIC PANEL
ALT: 21 U/L (ref 0–44)
AST: 36 U/L (ref 15–41)
Albumin: 4.1 g/dL (ref 3.5–5.0)
Alkaline Phosphatase: 48 U/L (ref 38–126)
Anion gap: 15 (ref 5–15)
BUN: 23 mg/dL (ref 8–23)
CO2: 19 mmol/L — ABNORMAL LOW (ref 22–32)
Calcium: 9.8 mg/dL (ref 8.9–10.3)
Chloride: 102 mmol/L (ref 98–111)
Creatinine, Ser: 1.21 mg/dL — ABNORMAL HIGH (ref 0.44–1.00)
GFR calc Af Amer: 48 mL/min — ABNORMAL LOW (ref >60)
GFR calc non Af Amer: 41 mL/min — ABNORMAL LOW (ref >60)
Glucose, Bld: 191 mg/dL — ABNORMAL HIGH (ref 70–99)
Potassium: 2.6 mmol/L — CL (ref 3.5–5.1)
Sodium: 136 mmol/L (ref 135–145)
Total Bilirubin: 1.5 mg/dL — ABNORMAL HIGH (ref 0.3–1.2)
Total Protein: 6.9 g/dL (ref 6.5–8.1)

## 2020-02-28 LAB — CBC
HCT: 48.2 % — ABNORMAL HIGH (ref 36.0–46.0)
Hemoglobin: 16.4 g/dL — ABNORMAL HIGH (ref 12.0–15.0)
MCH: 32.4 pg (ref 26.0–34.0)
MCHC: 34 g/dL (ref 30.0–36.0)
MCV: 95.3 fL (ref 80.0–100.0)
Platelets: 302 10*3/uL (ref 150–400)
RBC: 5.06 MIL/uL (ref 3.87–5.11)
RDW: 13 % (ref 11.5–15.5)
WBC: 16.1 10*3/uL — ABNORMAL HIGH (ref 4.0–10.5)
nRBC: 0 % (ref 0.0–0.2)

## 2020-02-28 LAB — RESPIRATORY PANEL BY RT PCR (FLU A&B, COVID)
Influenza A by PCR: NEGATIVE
Influenza B by PCR: NEGATIVE
SARS Coronavirus 2 by RT PCR: NEGATIVE

## 2020-02-28 LAB — URINALYSIS, COMPLETE (UACMP) WITH MICROSCOPIC
Bacteria, UA: NONE SEEN
Bilirubin Urine: NEGATIVE
Glucose, UA: >=500 mg/dL — AB
Ketones, ur: 20 mg/dL — AB
Leukocytes,Ua: NEGATIVE
Nitrite: NEGATIVE
Protein, ur: >=300 mg/dL — AB
Specific Gravity, Urine: 1.01 (ref 1.005–1.030)
pH: 7 (ref 5.0–8.0)

## 2020-02-28 LAB — LACTIC ACID, PLASMA
Lactic Acid, Venous: 4.1 mmol/L (ref 0.5–1.9)
Lactic Acid, Venous: 4.8 mmol/L (ref 0.5–1.9)

## 2020-02-28 LAB — TROPONIN I (HIGH SENSITIVITY): Troponin I (High Sensitivity): 15 ng/L (ref <18)

## 2020-02-28 MED ORDER — ENOXAPARIN SODIUM 40 MG/0.4ML ~~LOC~~ SOLN
30.0000 mg | SUBCUTANEOUS | Status: DC
  Administered 2020-02-29: 30 mg via SUBCUTANEOUS
  Filled 2020-02-28: qty 0.4

## 2020-02-28 MED ORDER — SODIUM CHLORIDE 0.9 % IV SOLN: Freq: Once | INTRAVENOUS | Status: AC

## 2020-02-28 MED ORDER — VANCOMYCIN HCL IN DEXTROSE 1-5 GM/200ML-% IV SOLN
1000.0000 mg | Freq: Once | INTRAVENOUS | Status: AC
  Administered 2020-02-28: 1000 mg via INTRAVENOUS
  Filled 2020-02-28: qty 200

## 2020-02-28 MED ORDER — SODIUM CHLORIDE 0.9 % IV SOLN
2.0000 g | INTRAVENOUS | Status: DC
  Administered 2020-02-29: 2 g via INTRAVENOUS
  Filled 2020-02-28 (×2): qty 2

## 2020-02-28 MED ORDER — POTASSIUM CHLORIDE 10 MEQ/100ML IV SOLN
10.0000 meq | INTRAVENOUS | Status: AC
  Administered 2020-02-28 (×3): 10 meq via INTRAVENOUS
  Filled 2020-02-28 (×3): qty 100

## 2020-02-28 MED ORDER — LACTATED RINGERS IV BOLUS (SEPSIS)
500.0000 mL | Freq: Once | INTRAVENOUS | Status: AC
  Administered 2020-02-28: 500 mL via INTRAVENOUS

## 2020-02-28 MED ORDER — CEFEPIME HCL 2 G IJ SOLR
2.0000 g | Freq: Once | INTRAMUSCULAR | Status: AC
  Administered 2020-02-28: 2 g via INTRAVENOUS
  Filled 2020-02-28: qty 2

## 2020-02-28 MED ORDER — METOPROLOL TARTRATE 5 MG/5ML IV SOLN
2.5000 mg | INTRAVENOUS | Status: DC | PRN
  Administered 2020-02-28: 2.5 mg via INTRAVENOUS
  Filled 2020-02-28: qty 5

## 2020-02-28 MED ORDER — METRONIDAZOLE IN NACL 5-0.79 MG/ML-% IV SOLN
500.0000 mg | Freq: Once | INTRAVENOUS | Status: AC
  Administered 2020-02-28: 500 mg via INTRAVENOUS
  Filled 2020-02-28: qty 100

## 2020-02-28 MED ORDER — POTASSIUM CHLORIDE 10 MEQ/100ML IV SOLN
10.0000 meq | Freq: Once | INTRAVENOUS | Status: AC
  Administered 2020-02-28: 10 meq via INTRAVENOUS
  Filled 2020-02-28: qty 100

## 2020-02-28 MED ORDER — LACTATED RINGERS IV BOLUS (SEPSIS)
250.0000 mL | Freq: Once | INTRAVENOUS | Status: AC
  Administered 2020-02-28: 250 mL via INTRAVENOUS

## 2020-02-28 MED ORDER — SODIUM CHLORIDE 0.9 % IV SOLN: 1.0000 g | Freq: Two times a day (BID) | INTRAVENOUS | Status: DC

## 2020-02-28 MED ORDER — ENOXAPARIN SODIUM 40 MG/0.4ML ~~LOC~~ SOLN: 40.0000 mg | SUBCUTANEOUS | Status: DC

## 2020-02-28 MED ORDER — LACTATED RINGERS IV BOLUS (SEPSIS)
1000.0000 mL | Freq: Once | INTRAVENOUS | Status: AC
  Administered 2020-02-28: 1000 mL via INTRAVENOUS

## 2020-02-28 NOTE — ED Notes (Signed)
Dr. Corky Downs aware of potassium of 2.6.

## 2020-02-28 NOTE — ED Notes (Signed)
Pt states that she needs to void, purewik placed at low suction for pt as well as an absorbent chux, no distress noted at this time, cont to monitor

## 2020-02-28 NOTE — ED Notes (Signed)
Patient's necklace and earrings have been placed in a specimen container with patient's label on it. Patient's friend is at bedside. Patient recognized the friend's face, but could not remember his name. Patient is calm, cooperative, speaks easily without slurs.

## 2020-02-28 NOTE — Progress Notes (Signed)
PHARMACIST - PHYSICIAN COMMUNICATION  CONCERNING:  Enoxaparin (Lovenox) for DVT Prophylaxis    RECOMMENDATION: Patient was prescribed enoxaprin 40mg  q24 hours for VTE prophylaxis.   Filed Weights   02/28/20 1350  Weight: 54.4 kg (120 lb)    Body mass index is 21.26 kg/m.  Estimated Creatinine Clearance: 29.1 mL/min (A) (by C-G formula based on SCr of 1.21 mg/dL (H)).   Patient is candidate for enoxaparin 30mg  every 24 hours based on CrCl <37ml/min. Patient has an AKI presently. Will adjust enoxaparin based on morning labs.    DESCRIPTION: Pharmacy has adjusted enoxaparin dose per [facility] policy.  Patient is now receiving enoxaparin 30mg  every 24 hours.  Rowland Lathe, PharmD Clinical Pharmacist  02/28/2020 6:56 PM

## 2020-02-28 NOTE — ED Notes (Addendum)
This RN at bedside after seeing pt standing at door yelling for help. Pt with notable blood running from right arm. Pt had removed both IVs with fluids and anx still running. Pt had pulled off zoll pads, heart monitor, oxygen monitor and BP cuff.   This RN assisted pt back in bed. Pt stating, "I am hungry." This RN assuring pt that she needs to hit the call bell when she needs something since it is not safe her to get out of bed. This RN called Agricultural consultant for assistance. Zoll pads placed on pt. Clean gown placed on pt. Pt placed back on monitor. IV access trying to be obtained at this time. Admitting MD at bedside.

## 2020-02-28 NOTE — ED Notes (Signed)
Patient's son is at bedside. Patient is able to recall her full name and birthdate at this time.

## 2020-02-28 NOTE — ED Notes (Signed)
Spoke with tamara, rn on floor who states they are reviewing pt's chart and appropriateness for floor at this time with nursing supervisor.

## 2020-02-28 NOTE — H&P (Signed)
Sophia Soto is an 86 y.o. female.   Chief Complaint: Altered mental status. HPI:  Patient is 86 year old female with history of essential hypertension, breast cancer, recently diagnosed Alzheimer's disease who presents to the emergency room with altered mental status. Patient is a poor historian, history was obtained from talk to the nursing staff, I also spoke with this patient is son who does not live with the patient. Patient currently lives by herself with her friends checking her her every day. She is still taking care of her for all ADLs. She was recently diagnosed with Alzheimer disease, her medication was just recently adjusted by her doctor. She has not been eating well for the last 6 months, she has been losing weight. She had increased confusion yesterday; she also became weaker, she can barely walk since yesterday. It was much worse today. She can barely stand. He states that she has not been eating for the last few days. By the time he reached the emergency room, he did not remember her last name. She was found to have temperature of 43F. She had a lactic acidosis of 4.1, leukocytosis 16.1, potassium 2.6, bilirubin 1.5, study does not seem to be consistent with UTI. Chest x-ray does not have acute changes. Does not have acute intracranial abnormalities.  Due to concern for sepsis with elevated lactic acid, she is given IV fluid bolus at 30 mL/kg. She is also started on vancomycin and cefepime.  Past Medical History:  Diagnosis Date  . Breast cancer, left ([place]) 2006   Lumpectomy, chemo + rad tx's  . Hematuria   . Hypertension     Past Surgical History:  Procedure Laterality Date  . BREAST LUMPECTOMY  2006  . broken neck  2006    Family History  Problem Relation Age of Onset  . Prostate cancer Neg Hx   . Kidney cancer Neg Hx   . Bladder Cancer Neg Hx    Social History:  reports that she has quit smoking. She has never used smokeless tobacco. She reports current alcohol  use. She reports that she does not use drugs.  Allergies:  Allergies  Allergen Reactions  . Penicillin G Rash    (Not in a hospital admission)   Results for orders placed or performed during the hospital encounter of 02/28/20 (from the past 48 hour(s))  CBC     Status: Abnormal   Collection Time: 02/28/20  2:02 PM  Result Value Ref Range   WBC 16.1 (H) 4.0 - 10.5 K/uL   RBC 5.06 3.87 - 5.11 MIL/uL   Hemoglobin 16.4 (H) 12.0 - 15.0 g/dL   HCT 48.2 (H) 36.0 - 46.0 %   MCV 95.3 80.0 - 100.0 fL   MCH 32.4 26.0 - 34.0 pg   MCHC 34.0 30.0 - 36.0 g/dL   RDW 13.0 11.5 - 15.5 %   Platelets 302 150 - 400 K/uL   nRBC 0.0 0.0 - 0.2 %    Comment: Performed at [facility], [address] [place], [place] [address]  Comprehensive metabolic panel     Status: Abnormal   Collection Time: 02/28/20  2:02 PM  Result Value Ref Range   Sodium 136 135 - 145 mmol/L   Potassium 2.6 (LL) 3.5 - 5.1 mmol/L    Comment: CRITICAL RESULT CALLED TO, READ BACK BY AND VERIFIED WITH [facility] WEAVER AT [id number] 02/28/20.PMF   Chloride 102 98 - 111 mmol/L   CO2 19 (L) 22 - 32 mmol/L   Glucose,  Bld 191 (H) 70 - 99 mg/dL    Comment: Glucose reference range applies only to samples taken after fasting for at least 8 hours.   BUN 23 8 - 23 mg/dL   Creatinine, Ser 1.21 (H) 0.44 - 1.00 mg/dL   Calcium 9.8 8.9 - 10.3 mg/dL   Total Protein 6.9 6.5 - 8.1 g/dL   Albumin 4.1 3.5 - 5.0 g/dL   AST 36 15 - 41 U/L   ALT 21 0 - 44 U/L   Alkaline Phosphatase 48 38 - 126 U/L   Total Bilirubin 1.5 (H) 0.3 - 1.2 mg/dL   GFR calc non Af Amer 41 (L) >60 mL/min   GFR calc Af Amer 48 (L) >60 mL/min   Anion gap 15 5 - 15    Comment: Performed at [facility], [place], [place] [address]  Troponin I (High Sensitivity)     Status: None   Collection Time: 02/28/20  2:02 PM  Result Value Ref Range   Troponin I (High Sensitivity) 15 <18 ng/L    Comment: (NOTE) Elevated high sensitivity troponin I  (hsTnI) values and significant  changes across serial measurements may suggest ACS but many other  chronic and acute conditions are known to elevate hsTnI results.  Refer to the "Links" section for chest pain algorithms and additional  guidance. Performed at [facility], [address] [place], [place] [address]   Urinalysis, Complete w Microscopic     Status: Abnormal   Collection Time: 02/28/20  3:06 PM  Result Value Ref Range   Color, Urine YELLOW (A) YELLOW   APPearance CLEAR (A) CLEAR   Specific Gravity, Urine 1.010 1.005 - 1.030   pH 7.0 5.0 - 8.0   Glucose, UA >=500 (A) NEGATIVE mg/dL   Hgb urine dipstick MODERATE (A) NEGATIVE   Bilirubin Urine NEGATIVE NEGATIVE   Ketones, ur 20 (A) NEGATIVE mg/dL   Protein, ur >=300 (A) NEGATIVE mg/dL   Nitrite NEGATIVE NEGATIVE   Leukocytes,Ua NEGATIVE NEGATIVE   RBC / HPF 21-50 0 - 5 RBC/hpf   WBC, UA 0-5 0 - 5 WBC/hpf   Bacteria, UA NONE SEEN NONE SEEN   Squamous Epithelial / LPF 0-5 0 - 5   Mucus PRESENT     Comment: Performed at [facility], [address] [place], [place] [address]  Lactic acid, plasma     Status: Abnormal   Collection Time: 02/28/20  3:47 PM  Result Value Ref Range   Lactic Acid, Venous 4.1 (HH) 0.5 - 1.9 mmol/L    Comment: CRITICAL RESULT CALLED TO, READ BACK BY AND VERIFIED WITH SONJIA WEAVER AT 1622 02/28/20.PMF Performed at [facility], [place], [place] [address]    CT Head Wo Contrast  Result Date: 02/28/2020 CLINICAL DATA:  86 year old female with history of altered mental status. EXAM: CT HEAD WITHOUT CONTRAST TECHNIQUE: Contiguous axial images were obtained from the base of the skull through the vertex without intravenous contrast. COMPARISON:  Head CT 04/28/2005. FINDINGS: Brain: Mild cerebral atrophy. Patchy and confluent areas of decreased attenuation are noted throughout the deep and periventricular white matter of the cerebral hemispheres bilaterally,  compatible with chronic microvascular ischemic disease. Small well-defined low-attenuation lesion in the left basal ganglia, compatible with an old lacunar infarct. No evidence of acute infarction, hemorrhage, hydrocephalus, extra-axial collection or mass lesion/mass effect. Vascular: No hyperdense vessel or unexpected calcification. Skull: Normal. Negative for fracture or focal lesion. Sinuses/Orbits: No acute finding.  Other: None. IMPRESSION: 1. No acute intracranial abnormalities. 2. Mild cerebral atrophy with chronic microvascular ischemic changes in the cerebral white matter and old left basal ganglia lacunar infarct. Electronically Signed   By: Vinnie Langton M.D.   On: 02/28/2020 14:40   DG Chest Portable 1 View  Result Date: 02/28/2020 CLINICAL DATA:  Weakness EXAM: PORTABLE CHEST 1 VIEW COMPARISON:  11/01/2007 FINDINGS: The heart size and mediastinal contours are within normal limits. Both lungs are clear. The visualized skeletal structures are unremarkable. IMPRESSION: No active disease. Electronically Signed   By: Kathreen Devoid   On: 02/28/2020 14:21    Review of Systems  Constitutional: Positive for activity change, appetite change, fatigue and unexpected weight change. Negative for chills, diaphoresis and fever.  HENT: Negative for congestion, postnasal drip and rhinorrhea.   Eyes: Negative for pain and redness.  Respiratory: Negative for cough, choking and shortness of breath.   Cardiovascular: Negative for chest pain, palpitations and leg swelling.  Gastrointestinal: Positive for nausea. Negative for abdominal pain, blood in stool, constipation, diarrhea and vomiting.  Genitourinary: Negative for dysuria, frequency and hematuria.  Musculoskeletal: Negative for back pain and joint swelling.  Neurological: Negative for dizziness and light-headedness.  Psychiatric/Behavioral: Positive for confusion. Negative for hallucinations.    Blood pressure (!) 175/100, pulse (!) 109, temperature  (!) 97.3 F (36.3 C), temperature source Oral, resp. rate (!) 21, height 5\' 3"  (1.6 m), weight 54.4 kg, SpO2 100 %. Physical Exam  Constitutional: No distress.  Appear thin,   HENT:  Head: Normocephalic and atraumatic.  Mouth/Throat: No oropharyngeal exudate.  Eyes: Pupils are equal, round, and reactive to light. Conjunctivae and EOM are normal. No scleral icterus.  Neck: No tracheal deviation present. No thyromegaly present.  Cardiovascular: Normal rate and regular rhythm. Exam reveals no gallop and no friction rub.  No murmur heard. Respiratory: Effort normal. She has no wheezes. She has no rales.  GI: Soft. Bowel sounds are normal. She exhibits no distension. There is no abdominal tenderness. There is no rebound.  Musculoskeletal:        General: No edema. Normal range of motion.     Cervical back: Normal range of motion and neck supple.  Neurological: She is alert. No cranial nerve deficit.  Oriented to person and place.  Skin: Skin is warm and dry. She is not diaphoretic.  Psychiatric: She has a normal mood and affect.     Assessment/Plan  #1. Severe sepsis versus SIRS.  Patient admitted severe sepsis criteria with hypothermia, tachycardia, leukocytosis, liver function changes, renal function changes as well as lactic acidosis. Currently, no source of infection is identified. Patient has received IV fluid bolus in the emergency room. She also received antibiotics with vancomycin and cefepime. I will hold off vancomycin for now, I will continue cefepime. If needed, can restart vancomycin tomorrow. I also check cortisol level and TSH the patient has hypothermia.   #2. Acute metabolic encephalopathy. This could be secondary to sepsis. This can also be secondary to change of her also medicines. We will continue monitor patient.  3. Essential hypertension. Blood pressure running high. Not sure if patient was taking blood pressure medicine at home. We will continue monitor. Nursing  staff will confirm patient home medicines.  4. Hypokalemia. Will give 40 mEq potassium chloride IV. Recheck a BMP and magnesium tomorrow.  5. Acute kidney injury. Received IV fluid bolus. Recheck a BMP tomorrow.  6. CODE STATUS. I discussed with patient son, patient is still full code.  Sharen Hones, MD 02/28/2020, 5:46 PM

## 2020-02-28 NOTE — ED Notes (Signed)
Report given to Butch RN 

## 2020-02-28 NOTE — ED Notes (Signed)
Attempt was made to call report. RN not available.

## 2020-02-28 NOTE — ED Notes (Signed)
Sharee Pimple RN from [employer] called and stated that the house supervisor Lelon Frohlich has been called for a sitter for the patient. Patient will remain in the ED until issue is resolved.

## 2020-02-28 NOTE — ED Notes (Signed)
Patient's son has left bedside. Patient is alert and oriented to self and situation. Patient is able to use Purewick without difficulty. Patient remains calm, cooperative and pleasant. No complaints of pain or dyspnea.

## 2020-02-28 NOTE — ED Provider Notes (Signed)
[facility] Emergency Department Provider Note   ____________________________________________    I have reviewed the triage vital signs and the nursing notes.   HISTORY  Chief Complaint Altered Mental Status  History limited by altered mental status   HPI Sophia Soto is a 86 y.o. female who presents with altered mental status.  Reportedly the patient found to be more confused than at baseline today, EMS was called.  No further history available from patient.  Review of medical records demonstrates the patient has seen her PCP recently and had diagnosis of likely Alzheimer's given memory loss.  Past Medical History:  Diagnosis Date  . Breast cancer, left ([place]) 2006   Lumpectomy, chemo + rad tx's  . Hematuria   . Hypertension     There are no problems to display for this patient.   Past Surgical History:  Procedure Laterality Date  . BREAST LUMPECTOMY  2006  . broken neck  2006    Prior to Admission medications   Not on File     Allergies Penicillin g  Family History  Problem Relation Age of Onset  . Prostate cancer Neg Hx   . Kidney cancer Neg Hx   . Bladder Cancer Neg Hx     Social History Social History   Tobacco Use  . Smoking status: Former Research scientist (life sciences)  . Smokeless tobacco: Never Used  . Tobacco comment: quit 1990  Substance Use Topics  . Alcohol use: Yes    Comment: wine at night  . Drug use: No    review of Systems limited by altered mental status   Cardiovascular: Denies chest pain. Respiratory: No shortness of breath Gastrointestinal: Denies abdominal  Musculoskeletal: Negative for back pain.  Neurological: Negative for headaches or weakness   ____________________________________________   PHYSICAL EXAM:  VITAL SIGNS: ED Triage Vitals  Enc Vitals Group     BP 02/28/20 1349 (!) 152/90     Pulse Rate 02/28/20 1349 95     Resp 02/28/20 1349 16     Temp --      Temp src --      SpO2 02/28/20 1349  98 %     Weight 02/28/20 1350 54.4 kg (120 lb)     Height 02/28/20 1350 1.6 m (5\' 3" )     Head Circumference --      Peak Flow --      Pain Score 02/28/20 1349 0     Pain Loc --      Pain Edu? --      Excl. in [place]? --     Constitutional: Alert, disoriented, well-groomed Eyes: Conjunctivae are normal.  EOMI Head: Atraumatic. Nose: No congestion/rhinnorhea. Mouth/Throat: Mucous membranes are moist.    Cardiovascular: Normal rate, regular rhythm.  Good peripheral circulation. Respiratory: Normal respiratory effort.  No retractions. Gastrointestinal: Soft and nontender. No distention.    Musculoskeletal: No lower extremity tenderness nor edema.  Warm and well perfused Neurologic:  Normal speech and language. No gross focal neurologic deficits are appreciated.  Cranial nerves II to XII normal Skin:  Skin is warm, dry and intact. No rash noted. Psychiatric: Mood and affect are normal. Speech and behavior are normal.  ____________________________________________   LABS (all labs ordered are listed, but only abnormal results are displayed)  Labs Reviewed  CBC - Abnormal; Notable for the following components:      Result Value   WBC 16.1 (*)    Hemoglobin 16.4 (*)    HCT 48.2 (*)  All other components within normal limits  COMPREHENSIVE METABOLIC PANEL - Abnormal; Notable for the following components:   Potassium 2.6 (*)    CO2 19 (*)    Glucose, Bld 191 (*)    Creatinine, Ser 1.21 (*)    Total Bilirubin 1.5 (*)    GFR calc non Af Amer 41 (*)    GFR calc Af Amer 48 (*)    All other components within normal limits  URINALYSIS, COMPLETE (UACMP) WITH MICROSCOPIC - Abnormal; Notable for the following components:   Color, Urine YELLOW (*)    APPearance CLEAR (*)    Glucose, UA >=500 (*)    Hgb urine dipstick MODERATE (*)    Ketones, ur 20 (*)    Protein, ur >=300 (*)    All other components within normal limits  LACTIC ACID, PLASMA - Abnormal; Notable for the following  components:   Lactic Acid, Venous 4.1 (*)    All other components within normal limits  CULTURE, BLOOD (ROUTINE X 2)  CULTURE, BLOOD (ROUTINE X 2)  RESPIRATORY PANEL BY RT PCR (FLU A&B, COVID)  LACTIC ACID, PLASMA  TROPONIN I (HIGH SENSITIVITY)   ____________________________________________  EKG  ED ECG REPORT I, Lavonia Drafts, the attending physician, personally viewed and interpreted this ECG.  Date: 02/28/2020  Rhythm: Atrial fibrillation QRS Axis: normal Intervals: Abnormal ST/T Wave abnormalities: Nonspecific change Narrative Interpretation: Atrial fibrillation, bigeminy  ____________________________________________  RADIOLOGY  Chest x-ray reviewed by me, reassuring, no abnormalities noted CT head without acute findings ____________________________________________   PROCEDURES  Procedure(s) performed:   .1-3 Lead EKG Interpretation Performed by: Lavonia Drafts, MD Authorized by: Lavonia Drafts, MD     Interpretation: abnormal     ECG rate assessment: normal     Rhythm: atrial fibrillation     Ectopy: bigeminy     Conduction: normal       Critical Care performed:yes  CRITICAL CARE Performed by: Lavonia Drafts   Total critical care time: 30 minutes  Critical care time was exclusive of separately billable procedures and treating other patients.  Critical care was necessary to treat or prevent imminent or life-threatening deterioration.  Critical care was time spent personally by me on the following activities: development of treatment plan with patient and/or surrogate as well as nursing, discussions with consultants, evaluation of patient's response to treatment, examination of patient, obtaining history from patient or surrogate, ordering and performing treatments and interventions, ordering and review of laboratory studies, ordering and review of radiographic studies, pulse oximetry and re-evaluation of patient's  condition.  ____________________________________________   INITIAL IMPRESSION / ASSESSMENT AND PLAN / ED COURSE  Pertinent labs & imaging results that were available during my care of the patient were reviewed by me and considered in my medical decision making (see chart for details).  Patient presents with altered mental status.  Review of medical records indicates the patient likely has Alzheimer's dementia at baseline.  Reportedly is more confused today.  Differential includes worsening dementia, metabolic encephalopathy, medication reaction, less likely CVA  Pending labs, noted to have abnormal rhythm on the monitor.  EKG most consistent with atrial fibrillation likely bigeminy.  Patient does not have any complaints of chest pain or palpitations.  No record of atrial fibrillation in the past.  White blood cell count of 16 raises suspicion for metabolic encephalopathy possibly secondary to infection, pending urinalysis, will perform in and out.  No family members here  Asked nurse to do in and out catheter, while obtaining urine also  performed rectal temperature found to be hypothermic, bear hugger ordered.  Lactic and blood cultures added for presumed sepsis, will cover with broad-spectrum antibiotics.  Notified of elevated lactic at 430 of 4.1.  Will add 30 mils per kilogram of LR  Son is here now, reports that she has significant confusion at baseline but seems to be worse today.  He is interested in nursing home placement when she is improved.  Discussed with hospitalist for admission  Sepsis - Repeat Assessment  Performed at:    1700  Vitals     Blood pressure (!) 175/100, pulse (!) 109, temperature (!) 97.3 F (36.3 C), temperature source Oral, resp. rate (!) 21, height 1.6 m (5\' 3" ), weight 54.4 kg, SpO2 100 %.  Heart:     Irregular rate and rhythm  Lungs:    CTA  Capillary Refill:   <2 sec  Peripheral Pulse:   Radial pulse palpable  Skin:     Normal Color         ____________________________________________   FINAL CLINICAL IMPRESSION(S) / ED DIAGNOSES  Final diagnoses:  Severe sepsis ([place])        Note:  This document was prepared using Dragon voice recognition software and may include unintentional dictation errors.   Lavonia Drafts, MD 02/28/20 1700

## 2020-02-28 NOTE — ED Notes (Signed)
Pharmacy called to retime potassium runs for 3 and 4 infusions. Patient is tolerating potassium infusion well.

## 2020-02-28 NOTE — Consult Note (Signed)
PHARMACY -  BRIEF ANTIBIOTIC NOTE   Pharmacy has received consult(s) for Vancomycin and Cefepime from an ED provider.  The patient's profile has been reviewed for ht/wt/allergies/indication/available labs.    One time order(s) placed for Vancomycin 1g x1 dose and Cefepime 2g x1 dose.   Further antibiotics/pharmacy consults should be ordered by admitting physician if indicated.                       Thank you, Rowland Lathe 02/28/2020  3:18 PM

## 2020-02-28 NOTE — ED Notes (Signed)
Patient refused to eat dinner tray. Patient requested and received saltines and ginger ale. Patient was redirected several times about why she was here. Patient was agreeable to stay in bed and call for help if needed.

## 2020-02-28 NOTE — ED Notes (Signed)
Attempted to call report to floor, but bed was not approved due to diastolic blood pressure numbers. Nurse practitioner was informed.

## 2020-02-28 NOTE — ED Notes (Signed)
Patient took off all leads and IV site on right arm. Patient had bloody urine in suction cannister and a blood clot was noted in patient's briefs. Patient denies pain. Rachael Fee NP paged. Patient is alert and oriented to self and situation. Patient was redirected again to call for help. Charge nurse April was informed and a request was made for a sitter.

## 2020-02-28 NOTE — ED Notes (Signed)
Admitting MD at bedside, okay to remove [place] now. Pt given new warm blankets.

## 2020-02-28 NOTE — ED Notes (Signed)
Pt anxious and confused. She keeps saying she has to toilet and has to constantly be reminded she has on an external catheter.AS

## 2020-02-28 NOTE — ED Notes (Signed)
Dr. Corky Downs aware of lactic of 4.1.

## 2020-02-28 NOTE — ED Triage Notes (Addendum)
Pt arrives via ems who was found by a friend who called ems due to the patient not acting herself, pt is unable to state what is happening, pt has recently changed from "prevagen" to "exelon" as of may 5th. of has an irregular heart rate at this time, family on scene was unable to give a history on the patient, ems reports pt was shaking and trembling for them, no trembling at this time. Pt is cool to touch and unable to obtain an oral temp at this time

## 2020-02-28 NOTE — ED Notes (Signed)
Pharmacist called to retime last dose of potassium.

## 2020-02-28 NOTE — ED Notes (Signed)
[place] ED tech is sitter at bedside.

## 2020-02-28 NOTE — ED Notes (Signed)
Entered room to find pt being helped back into bed by Claiborne Billings, RN, IVs pulled out, blood scattered on floor and bed linens. Pt's old IV sites cleaned, pressure applied, and dressing placed. New PIVs placed. Resumed flagyl and LR. Pt educated on need to use call bell to get up and to wait for staff d/t risk of injury. Pt repeatedly asking for food, states she was hungry and went to find something to eat. Explained that once diet order is placed by MD we can bring her food.

## 2020-02-28 NOTE — ED Notes (Signed)
Room darkened for patient's comfort. Door open for safety. Patient appears more oriented at this time. Patient shown again where call light is and given instructions for use.

## 2020-02-28 NOTE — ED Notes (Signed)
HR increasing to 210. Patient very anxious, Pt continually asks the same questions. Pt says she is ready to go.AS

## 2020-02-29 LAB — CBC WITH DIFFERENTIAL/PLATELET
Abs Immature Granulocytes: 0.08 10*3/uL — ABNORMAL HIGH (ref 0.00–0.07)
Basophils Absolute: 0 10*3/uL (ref 0.0–0.1)
Basophils Relative: 0 %
Eosinophils Absolute: 0 10*3/uL (ref 0.0–0.5)
Eosinophils Relative: 0 %
HCT: 41.7 % (ref 36.0–46.0)
Hemoglobin: 14.5 g/dL (ref 12.0–15.0)
Immature Granulocytes: 1 %
Lymphocytes Relative: 14 %
Lymphs Abs: 2 10*3/uL (ref 0.7–4.0)
MCH: 32.4 pg (ref 26.0–34.0)
MCHC: 34.8 g/dL (ref 30.0–36.0)
MCV: 93.3 fL (ref 80.0–100.0)
Monocytes Absolute: 1.1 10*3/uL — ABNORMAL HIGH (ref 0.1–1.0)
Monocytes Relative: 7 %
Neutro Abs: 11.1 10*3/uL — ABNORMAL HIGH (ref 1.7–7.7)
Neutrophils Relative %: 78 %
Platelets: 282 10*3/uL (ref 150–400)
RBC: 4.47 MIL/uL (ref 3.87–5.11)
RDW: 13.1 % (ref 11.5–15.5)
WBC: 14.2 10*3/uL — ABNORMAL HIGH (ref 4.0–10.5)
nRBC: 0 % (ref 0.0–0.2)

## 2020-02-29 LAB — BASIC METABOLIC PANEL
Anion gap: 6 (ref 5–15)
BUN: 19 mg/dL (ref 8–23)
CO2: 22 mmol/L (ref 22–32)
Calcium: 9.3 mg/dL (ref 8.9–10.3)
Chloride: 105 mmol/L (ref 98–111)
Creatinine, Ser: 0.82 mg/dL (ref 0.44–1.00)
GFR calc Af Amer: >60 mL/min (ref >60)
GFR calc non Af Amer: >60 mL/min (ref >60)
Glucose, Bld: 108 mg/dL — ABNORMAL HIGH (ref 70–99)
Potassium: 4 mmol/L (ref 3.5–5.1)
Sodium: 133 mmol/L — ABNORMAL LOW (ref 135–145)

## 2020-02-29 LAB — MAGNESIUM: Magnesium: 1.6 mg/dL — ABNORMAL LOW (ref 1.7–2.4)

## 2020-02-29 LAB — CORTISOL: Cortisol, Plasma: 42.3 ug/dL

## 2020-02-29 LAB — PHOSPHORUS: Phosphorus: 3.7 mg/dL (ref 2.5–4.6)

## 2020-02-29 LAB — TSH: TSH: 9.816 u[IU]/mL — ABNORMAL HIGH (ref 0.350–4.500)

## 2020-02-29 LAB — LACTIC ACID, PLASMA
Lactic Acid, Venous: 1.3 mmol/L (ref 0.5–1.9)
Lactic Acid, Venous: 1.1 mmol/L (ref 0.5–1.9)

## 2020-02-29 MED ORDER — MAGNESIUM SULFATE 2 GM/50ML IV SOLN
2.0000 g | Freq: Once | INTRAVENOUS | Status: AC
  Administered 2020-02-29: 2 g via INTRAVENOUS
  Filled 2020-02-29: qty 50

## 2020-02-29 MED ORDER — RIVASTIGMINE TARTRATE 3 MG PO CAPS
6.0000 mg | ORAL_CAPSULE | Freq: Two times a day (BID) | ORAL | Status: DC
  Administered 2020-02-29 – 2020-03-01 (×4): 6 mg via ORAL
  Filled 2020-02-29 (×8): qty 2

## 2020-02-29 MED ORDER — VENLAFAXINE HCL ER 37.5 MG PO CP24
37.5000 mg | ORAL_CAPSULE | Freq: Every day | ORAL | Status: DC
  Administered 2020-03-01 – 2020-03-02 (×2): 37.5 mg via ORAL
  Filled 2020-02-29 (×5): qty 1

## 2020-02-29 MED ORDER — ENOXAPARIN SODIUM 40 MG/0.4ML ~~LOC~~ SOLN
40.0000 mg | SUBCUTANEOUS | Status: DC
  Administered 2020-02-29 – 2020-03-01 (×2): 40 mg via SUBCUTANEOUS
  Filled 2020-02-29 (×2): qty 0.4

## 2020-02-29 MED ORDER — HALOPERIDOL LACTATE 5 MG/ML IJ SOLN
5.0000 mg | INTRAMUSCULAR | Status: AC
  Administered 2020-02-29: 5 mg via INTRAVENOUS
  Filled 2020-02-29: qty 1

## 2020-02-29 NOTE — Progress Notes (Signed)
Patient telemetry box reading HR 160-210. Patient resting in bed with eyes closed. EKG performed. HR 90s. Telemetry box changed and new electrodes placed. Telemetry box now showing NSR 90s. Will continue to monitor.

## 2020-02-29 NOTE — Plan of Care (Signed)
  Problem: Safety: Goal: Ability to remain free from injury will improve Outcome: Progressing   Problem: Skin Integrity: Goal: Risk for impaired skin integrity will decrease Outcome: Progressing   Problem: Clinical Measurements: Goal: Signs and symptoms of infection will decrease Outcome: Progressing

## 2020-02-29 NOTE — Progress Notes (Signed)
PROGRESS NOTE    Sophia Soto  NTI:[id number] DOB: 1934/09/09 DOA: 02/28/2020 PCP: Rusty Aus, MD  Brief Narrative:  Patient is 86 year old female with history of essential hypertension, breast cancer, recently diagnosed Alzheimer's disease who presents to the emergency room with altered mental status.  She was recently diagnosed with Alzheimer disease, her medication was just recently adjusted by her doctor. She has not been eating well for the last 6 months, she has been losing weight. She had increased confusion yesterday; she also became weaker. She states that she has not been eating for the last few days. By the time he reached the emergency room, he did not remember her last name. She was found to have temperature of 35F. She had a lactic acidosis of 4.1, leukocytosis 16.1, potassium 2.6, bilirubin 1.5, study does not seem to be consistent with UTI. Chest x-ray does not have acute changes. Does not have acute intracranial abnormalities.  Due to concern for sepsis with elevated lactic acid, she is given IV fluid bolus at 30 mL/kg. She is also started on vancomycin and cefepime     Assessment & Plan:   Active Problems:   Acute metabolic encephalopathy   Severe sepsis (HCC)   Hypokalemia   AKI (acute kidney injury) ([place])   Sepsis ([place])  #1. Severe sepsis versus SIRS. I still cannot see a source of infection.  Lactic acidosis could be secondary to severe dehydration.  At this point, I will continue cefepime, hold off vancomycin.  Blood culture so far has no growth.  Her hypothermia might be secondary to hypothyroidism.  Still pending cortisol level.   2.  Acute metabolic encephalopathy. Condition seem to be improved.  However, patient has significant dementia, due to recent mental status changes, I will hold off her medicines for dementia.  3.  Hypothyroidism. We will start a supplement if her cortisol level is not  Low.  4.  Essential hypertension. Continue to monitor  blood pressure.  5.  Hypokalemia and hypomagnesemia. Potassium is normalized today, will supplement magnesium.  6.  Acute kidney injury. Renal function normalized.  DVT prophylaxis: Lovenox Code Status: Full Family Communication: none at bedside. Disposition Plan:  . Patient came from: home            . Anticipated d/c place:Home . Barriers to d/c OR conditions which need to be met to effect a safe d/c:   Consultants:   None  Procedures: None Antimicrobials: Cefepime  Subjective: Patient still has significant confusion, appears to be chronic.  She denies any cough and shortness of breath.  No nausea vomiting or diarrhea.  No abdominal pain.  No dysuria or hematuria.  Objective: Vitals:   02/29/20 0118 02/29/20 0122 02/29/20 0252 02/29/20 0717  BP: (!) 136/96  (!) 122/92 124/73  Pulse: (!) 111 (!) 107 100 91  Resp: '19  18 20  '$ Temp: 98 F (36.7 C)   97.6 F (36.4 C)  TempSrc: Oral   Oral  SpO2: 98%  100% 96%  Weight:  47.2 kg    Height:  '5\' 3"'$  (1.6 m)      Intake/Output Summary (Last 24 hours) at 02/29/2020 0830 Last data filed at 02/29/2020 0046 Gross per 24 hour  Intake 2450 ml  Output --  Net 2450 ml   Filed Weights   02/28/20 1350 02/29/20 0122  Weight: 54.4 kg 47.2 kg    Examination:  General exam: Appears calm and comfortable  Respiratory system: Clear to auscultation. Respiratory effort normal.  Cardiovascular system: S1 & S2 heard, RRR. No JVD, murmurs, rubs, gallops or clicks. No pedal edema. Gastrointestinal system: Abdomen is nondistended, soft and nontender. No organomegaly or masses felt. Normal bowel sounds heard. Central nervous system: Alert and oriented to person and place. No focal neurological deficits. Extremities: Symmetric  Skin: No rashes, lesions or ulcers Psychiatry: Judgement and insight appear normal. Mood & affect appropriate.     Data Reviewed: I have personally reviewed following labs and imaging studies  CBC: Recent Labs   Lab 02/28/20 1402 02/29/20 0513  WBC 16.1* 14.2*  NEUTROABS  --  11.1*  HGB 16.4* 14.5  HCT 48.2* 41.7  MCV 95.3 93.3  PLT 302 646   Basic Metabolic Panel: Recent Labs  Lab 02/28/20 1402 02/29/20 0513  NA 136 133*  K 2.6* 4.0  CL 102 105  CO2 19* 22  GLUCOSE 191* 108*  BUN 23 19  CREATININE 1.21* 0.82  CALCIUM 9.8 9.3  MG  --  1.6*  PHOS  --  3.7   GFR: Estimated Creatinine Clearance: 38.7 mL/min (by C-G formula based on SCr of 0.82 mg/dL). Liver Function Tests: Recent Labs  Lab 02/28/20 1402  AST 36  ALT 21  ALKPHOS 48  BILITOT 1.5*  PROT 6.9  ALBUMIN 4.1   No results for input(s): LIPASE, AMYLASE in the last 168 hours. No results for input(s): AMMONIA in the last 168 hours. Coagulation Profile: No results for input(s): INR, PROTIME in the last 168 hours. Cardiac Enzymes: No results for input(s): CKTOTAL, CKMB, CKMBINDEX, TROPONINI in the last 168 hours. BNP (last 3 results) No results for input(s): PROBNP in the last 8760 hours. HbA1C: No results for input(s): HGBA1C in the last 72 hours. CBG: No results for input(s): GLUCAP in the last 168 hours. Lipid Profile: No results for input(s): CHOL, HDL, LDLCALC, TRIG, CHOLHDL, LDLDIRECT in the last 72 hours. Thyroid Function Tests: Recent Labs    02/29/20 0513  TSH 9.816*   Anemia Panel: No results for input(s): VITAMINB12, FOLATE, FERRITIN, TIBC, IRON, RETICCTPCT in the last 72 hours. Sepsis Labs: Recent Labs  Lab 02/28/20 1547 02/28/20 1737 02/29/20 0513 02/29/20 0750  LATICACIDVEN 4.1* 4.8* 1.3 1.1    Recent Results (from the past 240 hour(s))  Blood culture (routine x 2)     Status: None (Preliminary result)   Collection Time: 02/28/20  3:47 PM   Specimen: BLOOD  Result Value Ref Range Status   Specimen Description BLOOD BLOOD RIGHT FOREARM  Final   Special Requests   Final    BOTTLES DRAWN AEROBIC AND ANAEROBIC Blood Culture results may not be optimal due to an inadequate volume of  blood received in culture bottles   Culture   Final    NO GROWTH < 24 HOURS Performed at [facility], [address] [place], [place] [address]    Report Status PENDING  Incomplete  Blood culture (routine x 2)     Status: None (Preliminary result)   Collection Time: 02/28/20  3:54 PM   Specimen: BLOOD  Result Value Ref Range Status   Specimen Description BLOOD LEFT ANTECUBITAL  Final   Special Requests   Final    BOTTLES DRAWN AEROBIC AND ANAEROBIC Blood Culture results may not be optimal due to an inadequate volume of blood received in culture bottles   Culture   Final    NO GROWTH < 24 HOURS Performed at [facility], [address], [place], [place] [address]    Report  Status PENDING  Incomplete  Respiratory Panel by RT PCR (Flu A&B, Covid) - Nasopharyngeal Swab     Status: None   Collection Time: 02/28/20  5:37 PM   Specimen: Nasopharyngeal Swab  Result Value Ref Range Status   SARS Coronavirus 2 by RT PCR NEGATIVE NEGATIVE Final    Comment: (NOTE) SARS-CoV-2 target nucleic acids are NOT DETECTED. The SARS-CoV-2 RNA is generally detectable in upper respiratoy specimens during the acute phase of infection. The lowest concentration of SARS-CoV-2 viral copies this assay can detect is 131 copies/mL. A negative result does not preclude SARS-Cov-2 infection and should not be used as the sole basis for treatment or other patient management decisions. A negative result may occur with  improper specimen collection/handling, submission of specimen other than nasopharyngeal swab, presence of viral mutation(s) within the areas targeted by this assay, and inadequate number of viral copies (<131 copies/mL). A negative result must be combined with clinical observations, patient history, and epidemiological information. The expected result is Negative. Fact Sheet for Patients:  PinkCheek.be Fact Sheet for Healthcare Providers:   GravelBags.it This test is not yet ap proved or cleared by the [place] FDA and  has been authorized for detection and/or diagnosis of SARS-CoV-2 by FDA under an Emergency Use Authorization (EUA). This EUA will remain  in effect (meaning this test can be used) for the duration of the COVID-19 declaration under Section 564(b)(1) of the Act, 21 U.S.C. section 360bbb-3(b)(1), unless the authorization is terminated or revoked sooner.    Influenza A by PCR NEGATIVE NEGATIVE Final   Influenza B by PCR NEGATIVE NEGATIVE Final    Comment: (NOTE) The Xpert Xpress SARS-CoV-2/FLU/RSV assay is intended as an aid in  the diagnosis of influenza from Nasopharyngeal swab specimens and  should not be used as a sole basis for treatment. Nasal washings and  aspirates are unacceptable for Xpert Xpress SARS-CoV-2/FLU/RSV  testing. Fact Sheet for Patients: PinkCheek.be Fact Sheet for Healthcare Providers: GravelBags.it This test is not yet approved or cleared by the [place] FDA and  has been authorized for detection and/or diagnosis of SARS-CoV-2 by  FDA under an Emergency Use Authorization (EUA). This EUA will remain  in effect (meaning this test can be used) for the duration of the  Covid-19 declaration under Section 564(b)(1) of the Act, 21  U.S.C. section 360bbb-3(b)(1), unless the authorization is  terminated or revoked. Performed at [facility], [address] [place], [place] [address]          Radiology Studies: CT Head Wo Contrast  Result Date: 02/28/2020 CLINICAL DATA:  86 year old female with history of altered mental status. EXAM: CT HEAD WITHOUT CONTRAST TECHNIQUE: Contiguous axial images were obtained from the base of the skull through the vertex without intravenous contrast. COMPARISON:  Head CT 04/28/2005. FINDINGS: Brain: Mild cerebral atrophy. Patchy and confluent areas of  decreased attenuation are noted throughout the deep and periventricular white matter of the cerebral hemispheres bilaterally, compatible with chronic microvascular ischemic disease. Small well-defined low-attenuation lesion in the left basal ganglia, compatible with an old lacunar infarct. No evidence of acute infarction, hemorrhage, hydrocephalus, extra-axial collection or mass lesion/mass effect. Vascular: No hyperdense vessel or unexpected calcification. Skull: Normal. Negative for fracture or focal lesion. Sinuses/Orbits: No acute finding. Other: None. IMPRESSION: 1. No acute intracranial abnormalities. 2. Mild cerebral atrophy with chronic microvascular ischemic changes in the cerebral white matter and old left basal ganglia lacunar infarct. Electronically Signed   By: Mauri Brooklyn.D.  On: 02/28/2020 14:40   DG Chest Portable 1 View  Result Date: 02/28/2020 CLINICAL DATA:  Weakness EXAM: PORTABLE CHEST 1 VIEW COMPARISON:  11/01/2007 FINDINGS: The heart size and mediastinal contours are within normal limits. Both lungs are clear. The visualized skeletal structures are unremarkable. IMPRESSION: No active disease. Electronically Signed   By: Kathreen Devoid   On: 02/28/2020 14:21        Scheduled Meds: . enoxaparin (LOVENOX) injection  30 mg Subcutaneous Q24H  . rivastigmine  6 mg Oral BID  . venlafaxine XR  37.5 mg Oral Q breakfast   Continuous Infusions: . ceFEPime (MAXIPIME) IV    . magnesium sulfate bolus IVPB       LOS: 1 day    Time spent: 25 minutes    Sharen Hones, MD [facility]   To contact the attending provider between 7A-7P or the covering provider during after hours 7P-7A, please log into the web site [email or web address] and access using universal [place] password for that web site. If you do not have the password, please call the hospital operator.  02/29/2020, 8:30 AM

## 2020-02-29 NOTE — Plan of Care (Signed)
  Problem: Education: Goal: Knowledge of General Education information will improve Description: Including pain rating scale, medication(s)/side effects and non-pharmacologic comfort measures Outcome: Progressing   Problem: Health Behavior/Discharge Planning: Goal: Ability to manage health-related needs will improve Outcome: Progressing   Problem: Clinical Measurements: Goal: Ability to maintain clinical measurements within normal limits will improve Outcome: Not Progressing Note: Serum sodium is low at only 133. Will continue to monitor lab values for the remainder of the shift. Wenda Low [facility]

## 2020-02-29 NOTE — Progress Notes (Signed)
PHARMACIST - PHYSICIAN COMMUNICATION  CONCERNING:  Enoxaparin (Lovenox) for DVT Prophylaxis    RECOMMENDATION: Patient was prescribed enoxaprin 30mg  q24 hours for VTE prophylaxis.   Filed Weights   02/28/20 1350 02/29/20 0122  Weight: 54.4 kg (120 lb) 47.2 kg (104 lb 1.6 oz)    Body mass index is 18.44 kg/m.  Estimated Creatinine Clearance: 38.7 mL/min (by C-G formula based on SCr of 0.82 mg/dL).   Patient is candidate for enoxaparin 40mg  every 24 hours based on CrCl >60ml/min and weight > 45kg  DESCRIPTION: Pharmacy has adjusted enoxaparin dose per [facility] policy.  Patient is now receiving enoxaparin 40mg  every 24 hours.  Cheri Guppy, PharmD Clinical Pharmacist  02/29/2020 9:25 AM

## 2020-02-29 NOTE — Progress Notes (Addendum)
Cross coverage brief note Notified by RN on [employer] concern regarding patient with tachycardia 166 going to medsug floor.  EKG showed ST rate 106 with minimal ST changes anterior lateral leads  Discussed with ED RN, pateint was with transient elevation of heart rate secondary to increased agitation in patient with dementia and confusion.  Bedside eval, patient remains confused, somewhat impulsive. Not able to reorient.  Sitter remains at bedside. Heart rate remains around 106, BP stable While completing note, patent with increased agitation and attempting to get out of bed, unable to redirect and fighting with staff Will attempt haldol with state of increased delirium and high risk of harm to herself and staff.

## 2020-03-01 LAB — CBC WITH DIFFERENTIAL/PLATELET
Abs Immature Granulocytes: 0.06 10*3/uL (ref 0.00–0.07)
Basophils Absolute: 0 10*3/uL (ref 0.0–0.1)
Basophils Relative: 0 %
Eosinophils Absolute: 0 10*3/uL (ref 0.0–0.5)
Eosinophils Relative: 0 %
HCT: 43.1 % (ref 36.0–46.0)
Hemoglobin: 14.7 g/dL (ref 12.0–15.0)
Immature Granulocytes: 1 %
Lymphocytes Relative: 8 %
Lymphs Abs: 0.9 10*3/uL (ref 0.7–4.0)
MCH: 32.2 pg (ref 26.0–34.0)
MCHC: 34.1 g/dL (ref 30.0–36.0)
MCV: 94.5 fL (ref 80.0–100.0)
Monocytes Absolute: 0.8 10*3/uL (ref 0.1–1.0)
Monocytes Relative: 7 %
Neutro Abs: 9.7 10*3/uL — ABNORMAL HIGH (ref 1.7–7.7)
Neutrophils Relative %: 84 %
Platelets: 276 10*3/uL (ref 150–400)
RBC: 4.56 MIL/uL (ref 3.87–5.11)
RDW: 13.2 % (ref 11.5–15.5)
WBC: 11.5 10*3/uL — ABNORMAL HIGH (ref 4.0–10.5)
nRBC: 0 % (ref 0.0–0.2)

## 2020-03-01 LAB — BASIC METABOLIC PANEL
Anion gap: 9 (ref 5–15)
BUN: 19 mg/dL (ref 8–23)
CO2: 24 mmol/L (ref 22–32)
Calcium: 9.4 mg/dL (ref 8.9–10.3)
Chloride: 103 mmol/L (ref 98–111)
Creatinine, Ser: 0.85 mg/dL (ref 0.44–1.00)
GFR calc Af Amer: >60 mL/min (ref >60)
GFR calc non Af Amer: >60 mL/min (ref >60)
Glucose, Bld: 116 mg/dL — ABNORMAL HIGH (ref 70–99)
Potassium: 3.8 mmol/L (ref 3.5–5.1)
Sodium: 136 mmol/L (ref 135–145)

## 2020-03-01 LAB — MAGNESIUM: Magnesium: 2.3 mg/dL (ref 1.7–2.4)

## 2020-03-01 MED ORDER — MELATONIN 5 MG PO TABS
5.0000 mg | ORAL_TABLET | Freq: Once | ORAL | Status: DC
  Filled 2020-03-01: qty 1

## 2020-03-01 MED ORDER — LEVOTHYROXINE SODIUM 50 MCG PO TABS
50.0000 ug | ORAL_TABLET | Freq: Every day | ORAL | Status: DC
  Administered 2020-03-02: 50 ug via ORAL
  Filled 2020-03-01: qty 1

## 2020-03-01 MED ORDER — LEVOTHYROXINE SODIUM 50 MCG PO TABS: 50.0000 ug | ORAL_TABLET | Freq: Every day | ORAL | 0 refills | Status: AC

## 2020-03-01 MED ORDER — SODIUM CHLORIDE 0.9% FLUSH: 3.0000 mL | Freq: Two times a day (BID) | INTRAVENOUS | Status: DC

## 2020-03-01 NOTE — Progress Notes (Signed)
Pt requested sleeping med. Sharion Settler NP notified via Secure chat and ordered Melatonin. Patient was sleeping when I went in to giver her med. Stated she no longer wanted anything to help her sleep. Melatonin returned to [facility].

## 2020-03-01 NOTE — Progress Notes (Signed)
MD made aware that pt does not have any help when she goes home. Pt will stay again tonight and refer to case manager.

## 2020-03-01 NOTE — Discharge Summary (Addendum)
Physician Discharge Summary  Patient ID: Sophia Soto MRN: [id number] DOB/AGE: August 18, 1934 86 y.o.  Admit date: 02/28/2020 Discharge date: 03/01/2020  Admission Diagnoses: Acute metabolic encephalopathy Discharge Diagnoses:  Active Problems:   Acute metabolic encephalopathy   Severe sepsis (HCC)   Hypokalemia   AKI (acute kidney injury) ([place])   Sepsis ([place])   Discharged Condition: good  Hospital Course:  Patient is 86 year old female with history of essential hypertension, breast cancer, recently diagnosed Alzheimer's disease who presents to the emergency room with altered mental status.  She was recently diagnosed with Alzheimer disease, her medication was just recently adjusted by her doctor. She has not been eating well for the last 6 months, she has been losing weight. She had increased confusion yesterday;she also became weaker. She states that she has not been eating for the last few days. By the time he reached the emergency room, he did not remember her last name. She was found to have temperature of 72F.She had a lactic acidosis of 4.1, leukocytosis 16.1, potassium 2.6, bilirubin 1.5, study does not seem to be consistent with UTI. Chest x-ray does not have acute changes. Does not have acute intracranial abnormalities.  Due to concern for sepsis with elevated lactic acid,she is given IV fluid bolus at 30 mL/kg. She is also started on vancomycin and cefepime  #1. Severe sepsis versus SIRS. I still cannot see a source of infection.  Lactic acidosis could be secondary to severe dehydration.  Her hypothermia might be secondary to hypothyroidism.    Cortisol level was normal.  I will start Synthroid.  Lactic acid level has normalized.  No evidence of infection.  I will discontinue cefepime and discharge patient.  2.  Acute metabolic encephalopathy. Condition seem to be improved.  However, patient has significant dementia, due to recent mental status changes, I will hold off  her medicines for dementia.  3.  Hypothyroidism. Cortisol level was high due to stress.  We will start Synthroid.  4.  Essential hypertension. Continue to monitor blood pressure.  5.  Hypokalemia and hypomagnesemia. Normalized.  6.  Acute kidney injury. Renal function normalized.   Consults: None  Significant Diagnostic Studies:  Lactic acidosis, acute kidney injury.  Treatments:  IV fluids, antibiotics with cefepime  Discharge Exam: Blood pressure 125/80, pulse (!) 102, temperature 98.1 F (36.7 C), temperature source Oral, resp. rate 18, height 5\' 3"  (1.6 m), weight 47.2 kg, SpO2 100 %. General appearance: alert and cooperative Resp: clear to auscultation bilaterally Cardio: regular rate and rhythm, S1, S2 normal, no murmur, click, rub or gallop GI: soft, non-tender; bowel sounds normal; no masses,  no organomegaly Extremities: extremities normal, atraumatic, no cyanosis or edema  She is alert and oriented to time, place and person today.  Disposition: Discharge disposition: 01-Home or Self Care       Discharge Instructions    Diet - low sodium heart healthy   Complete by: As directed    Increase activity slowly   Complete by: As directed      Allergies as of 03/01/2020      Reactions   Penicillin G Rash      Medication List    STOP taking these medications   rivastigmine 6 MG capsule Commonly known as: EXELON     TAKE these medications   levothyroxine 50 MCG tablet Commonly known as: SYNTHROID Take 1 tablet (50 mcg total) by mouth daily at 6 (six) AM.   venlafaxine XR 37.5 MG 24 hr capsule Commonly known  as: EFFEXOR-XR Take 37.5 mg by mouth daily with breakfast.      Follow-up Information    Rusty Aus, MD Follow up in 1 week(s).   Specialty: Internal Medicine Contact information: [address] [place] [address] [phone]           [phone].  Patient granddaughter came in this  afternoon, she is very concerned about patient living by herself.  She would like patient to stay for another day, and talk to social worker tomorrow to find a way to help patient at home.  I will hold discharge for today.  Signed: Sharen Hones 03/01/2020, 12:15 PM

## 2020-03-02 DIAGNOSIS — R6510 Systemic inflammatory response syndrome (SIRS) of non-infectious origin without acute organ dysfunction: Secondary | ICD-10-CM

## 2020-03-02 MED ORDER — RIVASTIGMINE TARTRATE 6 MG PO CAPS: 6.0000 mg | ORAL_CAPSULE | Freq: Every day | ORAL | 0 refills | Status: AC

## 2020-03-02 NOTE — Discharge Summary (Signed)
Physician Discharge Summary  Patient ID: Sophia Soto MRN: [id number] DOB/AGE: 07/21/1934 86 y.o.  Admit date: 02/28/2020 Discharge date: 03/02/2020  Admission Diagnoses:  Discharge Diagnoses:  Active Problems:   Acute metabolic encephalopathy   Severe sepsis (HCC)   Hypokalemia   AKI (acute kidney injury) ([place])   Sepsis ([place])   Discharged Condition: good  Hospital Course:  Patient is 86 year old female with history of essential hypertension, breast cancer, recently diagnosed Alzheimer's disease who presents to the emergency room with altered mental status. She was recently diagnosed with Alzheimer disease, her medication was just recently adjusted by her doctor. She has not been eating well for the last 6 months, she has been losing weight. She had increased confusion yesterday;she also became weaker. She states that she has not been eating for the last few days. By the time he reached the emergency room, he did not remember her last name. She was found to have temperature of 95F.She had a lactic acidosis of 4.1, leukocytosis 16.1, potassium 2.6, bilirubin 1.5, study does not seem to be consistent with UTI. Chest x-ray does not have acute changes. Does not have acute intracranial abnormalities.  Due to concern for sepsis with elevated lactic acid,she is given IV fluid bolus at 30 mL/kg. She is also started on vancomycin and cefepime  #1. Severe sepsis versus SIRS. I still cannot see a source of infection. Lactic acidosis could be secondary to severe dehydration. Her hypothermia might be secondary to hypothyroidism.   Cortisol level was normal.  I will start Synthroid.  Lactic acid level has normalized.  No evidence of infection.  I will discontinue cefepime and discharge patient.  2. Acute metabolic encephalopathy. Condition seem to be improved. However, patient has significant dementia.  Mental status had improved.  I will restart Exelon with a half dose.  3.  Hypothyroidism. Cortisol level was high due to stress.  We will start Synthroid.  4. Essential hypertension. Continue to monitor blood pressure.  5. Hypokalemia and hypomagnesemia. Normalized.  6. Acute kidney injury. Renal function normalized.  Patient was scheduled to be discharged yesterday, family request that the patient be kept in hospital for another day to arrange for additional care.  I have discussed with the patient son and daughter, POA, family has been prepared for patient will return home.  I will obtain home care nurse visit for few days.    Consults: None  Significant Diagnostic Studies: { CT HEAD WITHOUT CONTRAST  TECHNIQUE: Contiguous axial images were obtained from the base of the skull through the vertex without intravenous contrast.  COMPARISON:  Head CT 04/28/2005.  FINDINGS: Brain: Mild cerebral atrophy. Patchy and confluent areas of decreased attenuation are noted throughout the deep and periventricular white matter of the cerebral hemispheres bilaterally, compatible with chronic microvascular ischemic disease. Small well-defined low-attenuation lesion in the left basal ganglia, compatible with an old lacunar infarct. No evidence of acute infarction, hemorrhage, hydrocephalus, extra-axial collection or mass lesion/mass effect.  Vascular: No hyperdense vessel or unexpected calcification.  Skull: Normal. Negative for fracture or focal lesion.  Sinuses/Orbits: No acute finding.  Other: None.  IMPRESSION: 1. No acute intracranial abnormalities. 2. Mild cerebral atrophy with chronic microvascular ischemic changes in the cerebral white matter and old left basal ganglia lacunar infarct.   Electronically Signed   By: Vinnie Langton M.D.   On: 02/28/2020 14:40   Treatments: IV hydration  Discharge Exam: Blood pressure 112/64, pulse 71, temperature 98.4 F (36.9 C), resp. rate 17, height 5'  3" (1.6 m), weight 45.8 kg, SpO2  98 %. General appearance: alert and cooperative Resp: clear to auscultation bilaterally Cardio: regular rate and rhythm, S1, S2 normal, no murmur, click, rub or gallop GI: soft, non-tender; bowel sounds normal; no masses,  no organomegaly Extremities: extremities normal, atraumatic, no cyanosis or edema  Patient is oriented to time, place and person today.  Disposition: Discharge disposition: 01-Home or Self Care       Discharge Instructions    Diet - low sodium heart healthy   Complete by: As directed    Increase activity slowly   Complete by: As directed    Increase activity slowly   Complete by: As directed      Allergies as of 03/02/2020      Reactions   Penicillin G Rash      Medication List    TAKE these medications   levothyroxine 50 MCG tablet Commonly known as: SYNTHROID Take 1 tablet (50 mcg total) by mouth daily at 6 (six) AM.   rivastigmine 6 MG capsule Commonly known as: EXELON Take 1 capsule (6 mg total) by mouth daily. What changed: when to take this   venlafaxine XR 37.5 MG 24 hr capsule Commonly known as: EFFEXOR-XR Take 37.5 mg by mouth daily with breakfast.      Follow-up Information    Rusty Aus, MD Follow up in 1 week(s).   Specialty: Internal Medicine Contact information: [place] [address] [phone]           Signed: Sharen Hones 03/02/2020, 2:49 PM

## 2020-03-02 NOTE — Care Management Important Message (Signed)
Important Message  Patient Details  Name: Sophia Soto MRN: [id number] Date of Birth: [birth year]   Medicare Important Message Given:  Yes     Dannette Barbara 03/02/2020, 12:23 PM

## 2020-03-02 NOTE — Progress Notes (Signed)
Patient resting comfortably in bed throughout the night. Sitter called stating patient was coughing continuously. I assessed patient and no cough was noted. Reoriented pt at this time and will continue to monitor.

## 2020-03-04 LAB — CULTURE, BLOOD (ROUTINE X 2)
Culture: NO GROWTH
Culture: NO GROWTH

## 2021-04-25 NOTE — ED Triage Notes (Signed)
Pt family reports pt keeps running away from assisted living home and they need her admitted here. RN advised family that they would need to stay in the [place] with the pt while she waited for room.

## 2021-10-28 ENCOUNTER — Other Ambulatory Visit: Payer: Self-pay

## 2021-10-28 ENCOUNTER — Encounter: Payer: Self-pay | Admitting: Emergency Medicine

## 2021-10-28 ENCOUNTER — Emergency Department
Admission: EM | Admit: 2021-10-28 | Discharge: 2021-10-28 | Disposition: A | Discharge status: Home / Self Care | Payer: Medicare HMO | Attending: Emergency Medicine | Admitting: Emergency Medicine

## 2021-10-28 ENCOUNTER — Emergency Department: Payer: Medicare HMO

## 2021-10-28 DIAGNOSIS — I10 Essential (primary) hypertension: Secondary | ICD-10-CM | POA: Diagnosis not present

## 2021-10-28 DIAGNOSIS — L03116 Cellulitis of left lower limb: Secondary | ICD-10-CM | POA: Insufficient documentation

## 2021-10-28 DIAGNOSIS — F0390 Unspecified dementia without behavioral disturbance: Secondary | ICD-10-CM | POA: Diagnosis not present

## 2021-10-28 DIAGNOSIS — M7989 Other specified soft tissue disorders: Secondary | ICD-10-CM

## 2021-10-28 DIAGNOSIS — R2242 Localized swelling, mass and lump, left lower limb: Secondary | ICD-10-CM | POA: Diagnosis present

## 2021-10-28 DIAGNOSIS — Z853 Personal history of malignant neoplasm of breast: Secondary | ICD-10-CM | POA: Diagnosis not present

## 2021-10-28 MED ORDER — MUPIROCIN CALCIUM 2 % EX CREA: TOPICAL_CREAM | CUTANEOUS | 0 refills | Status: AC

## 2021-10-28 MED ORDER — DOXYCYCLINE MONOHYDRATE 100 MG PO TABS: 100.0000 mg | ORAL_TABLET | Freq: Two times a day (BID) | ORAL | 0 refills | Status: AC

## 2021-10-28 NOTE — Discharge Instructions (Addendum)
Please start taking the antibiotic twice a day for the next 5 days.  You can also apply the mupirocin to the wound.  Please try to keep the leg elevated when you are sitting down.  You can dress it with a nonadherent Vaseline dressing and avoid tape or Band-Aids which could pull her skin.

## 2021-10-28 NOTE — ED Triage Notes (Signed)
Pt comes into the ED via POV c/o left leg wound check.  Pt does admit that she hits her legs a lot, but the patient has a baseline of dementia.  Old skin tear present as well.  Pt is not diabetic.  Some redness and swelling noted around the area.

## 2021-10-28 NOTE — ED Provider Notes (Signed)
[facility] Provider Note    Event Date/Time   First MD Initiated Contact with Patient 10/28/21 1356     (approximate)   History   Wound Check   HPI  Sophia Soto is a 88 y.o. female with past medical history of dementia presents with a wound on the left leg and left leg swelling.  Patient is accompanied by her caregivers who provide independent history.  They noticed today that the leg was swollen compared to the right.  Also noticed a wound and area of bruising.  They are not sure if she hit the leg.  [name] is some swelling and pain.  While in the waiting room they removed a bandage and apparently there was a pustule that then burst and there was some bleeding.  She has not had fevers or any change in mental status.  At baseline has some dementia.     Past Medical History:  Diagnosis Date   Breast cancer, left ([place]) 2006   Lumpectomy, chemo + rad tx's   Hematuria    Hypertension     Patient Active Problem List   Diagnosis Date Noted   Acute metabolic encephalopathy 51/88/4166   Severe sepsis ([place]) 02/28/2020   Hypokalemia 02/28/2020   AKI (acute kidney injury) ([place]) 02/28/2020   Sepsis ([place]) 02/28/2020     Physical Exam  Triage Vital Signs: ED Triage Vitals  Enc Vitals Group     BP 10/28/21 1120 134/72     Pulse Rate 10/28/21 1120 73     Resp 10/28/21 1120 16     Temp 10/28/21 1120 98.8 F (37.1 C)     Temp Source 10/28/21 1120 Oral     SpO2 10/28/21 1120 98 %     Weight 10/28/21 1121 100 lb 15.5 oz (45.8 kg)     Height 10/28/21 1121 5\' 3"  (1.6 m)     Head Circumference --      Peak Flow --      Pain Score 10/28/21 1121 4     Pain Loc --      Pain Edu? --      Excl. in Jud? --     Most recent vital signs: Vitals:   10/28/21 1120  BP: 134/72  Pulse: 73  Resp: 16  Temp: 98.8 F (37.1 C)  SpO2: 98%     General: Awake, no distress.  CV:  Good peripheral perfusion.  Resp:  Normal effort.  Abd:  No distention.   Neuro:             Awake, Alert, Oriented x 3  Other:  Bilateral lower extremities with chronic venous stasis changes, left lower extremity with edema compared to right, there is a open wound on the medial aspect with some surrounding ecchymosis, bloody serous drainage, no purulence, there is some surrounding erythema with warmth, no crepitus 2+ DP pulses bilaterally   ED Results / Procedures / Treatments  Labs (all labs ordered are listed, but only abnormal results are displayed) Labs Reviewed - No data to display   EKG     RADIOLOGY The venous ultrasound obtained to the left lower extremity which is negative for DVT, agree with radiology report   PROCEDURES:  Critical Care performed: No  Procedures   MEDICATIONS ORDERED IN ED: Medications - No data to display   IMPRESSION / MDM / [place] / ED COURSE  I reviewed the triage vital signs and the nursing notes.  Differential diagnosis includes, but is not limited to, cellulitis, abscess, chronic venous stasis, DVT  Patient is an 88 year old female presenting with left lower extremity swelling and a wound on the leg.  She is accompanied by her caregivers who provide independent history that she has not had any fevers or change in mental status, they are unsure if she hit her leg.  On exam there is some erythema surrounding a area of ecchymosis and an open wound that has some bloody serous drainage but no purulence.  There is no crepitus and she has good pulses in the lower extremities.  Suspect cellulitis.  Given her reports of purulence we will treat as a purulent cellulitis with 5 days of doxycycline as well as mupirocin.  I advised placing a nonadherent dressing with Kerlix as patient has very thin skin that was ripped while the bandage was removed.  PCP follow-up in 1 week.  She is afebrile without signs of systemic illness I feel that this can be managed as an outpatient.     FINAL  CLINICAL IMPRESSION(S) / ED DIAGNOSES   Final diagnoses:  Cellulitis of left lower extremity     Rx / DC Orders   ED Discharge Orders          Ordered    doxycycline (ADOXA) 100 MG tablet  2 times daily        10/28/21 1445    mupirocin cream (BACTROBAN) 2 %        10/28/21 1445             Note:  This document was prepared using Dragon voice recognition software and may include unintentional dictation errors.   Rada Hay, MD 10/28/21 [phone]

## 2021-10-28 NOTE — ED Provider Triage Note (Signed)
Emergency Medicine Provider Triage Evaluation Note  Sophia Soto , a 88 y.o. female  was evaluated in triage.  Pt complains of open wound on left lower leg.  Caregivers are unsure of when the skin tear happened.  Removed the bandage and the large amount of blood was expelled.  Also has swelling in that left leg.  No swelling on the right.  No fever or chills.  Patient has baseline dementia.  Review of Systems  Positive: Swelling and wound to the left lower leg Negative: Fever, chills  Physical Exam  BP 134/72 (BP Location: Left Arm)    Pulse 73    Temp 98.8 F (37.1 C) (Oral)    Resp 16    Ht 5\' 3"  (1.6 m)    Wt 45.8 kg    SpO2 98%    BMI 17.89 kg/m  Gen:   Awake, no distress, baseline dementia Resp:  Normal effort  MSK:   Moves extremities without difficulty, open wound to the left lower extremity, with left foot and ankle are swollen, right ankle is not swollen Other:    Medical Decision Making  Medically screening exam initiated at 11:25 AM.  Appropriate orders placed.  Sophia Soto was informed that the remainder of the evaluation will be completed by another provider, this initial triage assessment does not replace that evaluation, and the importance of remaining in the ED until their evaluation is complete.     Versie Starks, PA-C 10/28/21 1126
